# Patient Record
Sex: Male | Born: 1960
Health system: Southern US, Community
[De-identification: ages and names within clinical notes are randomized; demographics above are authoritative.]

## PROBLEM LIST (undated history)

## (undated) DIAGNOSIS — K219 Gastro-esophageal reflux disease without esophagitis: Secondary | ICD-10-CM

## (undated) DIAGNOSIS — N471 Phimosis: Secondary | ICD-10-CM

## (undated) DIAGNOSIS — E119 Type 2 diabetes mellitus without complications: Secondary | ICD-10-CM

---

## 2007-01-31 ENCOUNTER — Encounter: Admission: RE | Admit: 2007-01-31 | Discharge: 2007-01-31 | Payer: Self-pay | Admitting: Family Medicine

## 2013-03-18 ENCOUNTER — Other Ambulatory Visit: Payer: Self-pay | Admitting: Hematology & Oncology

## 2013-04-02 ENCOUNTER — Other Ambulatory Visit: Payer: Self-pay | Admitting: Urology

## 2013-04-06 ENCOUNTER — Encounter (HOSPITAL_BASED_OUTPATIENT_CLINIC_OR_DEPARTMENT_OTHER): Payer: Self-pay | Admitting: *Deleted

## 2013-04-06 NOTE — Progress Notes (Signed)
NPO AFTER MN. ARRIVES AT 0845. NEEDS HG. 

## 2013-04-07 ENCOUNTER — Encounter (HOSPITAL_BASED_OUTPATIENT_CLINIC_OR_DEPARTMENT_OTHER): Payer: Self-pay | Admitting: Anesthesiology

## 2013-04-07 ENCOUNTER — Ambulatory Visit (HOSPITAL_BASED_OUTPATIENT_CLINIC_OR_DEPARTMENT_OTHER): Payer: 59 | Admitting: Anesthesiology

## 2013-04-07 ENCOUNTER — Ambulatory Visit (HOSPITAL_BASED_OUTPATIENT_CLINIC_OR_DEPARTMENT_OTHER)
Admission: RE | Admit: 2013-04-07 | Discharge: 2013-04-07 | Disposition: A | Discharge status: Home / Self Care | Payer: 59 | Source: Ambulatory Visit | Attending: Urology | Admitting: Urology

## 2013-04-07 ENCOUNTER — Encounter (HOSPITAL_BASED_OUTPATIENT_CLINIC_OR_DEPARTMENT_OTHER)
Admission: RE | Disposition: A | Discharge status: Home / Self Care | Payer: Self-pay | Source: Ambulatory Visit | Attending: Urology

## 2013-04-07 DIAGNOSIS — Z79899 Other long term (current) drug therapy: Secondary | ICD-10-CM | POA: Insufficient documentation

## 2013-04-07 DIAGNOSIS — K219 Gastro-esophageal reflux disease without esophagitis: Secondary | ICD-10-CM | POA: Insufficient documentation

## 2013-04-07 DIAGNOSIS — N471 Phimosis: Secondary | ICD-10-CM | POA: Insufficient documentation

## 2013-04-07 DIAGNOSIS — N476 Balanoposthitis: Secondary | ICD-10-CM | POA: Insufficient documentation

## 2013-04-07 DIAGNOSIS — N478 Other disorders of prepuce: Secondary | ICD-10-CM | POA: Insufficient documentation

## 2013-04-07 DIAGNOSIS — E669 Obesity, unspecified: Secondary | ICD-10-CM | POA: Insufficient documentation

## 2013-04-07 DIAGNOSIS — N529 Male erectile dysfunction, unspecified: Secondary | ICD-10-CM | POA: Insufficient documentation

## 2013-04-07 HISTORY — DX: Phimosis: N47.1

## 2013-04-07 HISTORY — DX: Gastro-esophageal reflux disease without esophagitis: K21.9

## 2013-04-07 HISTORY — PX: CIRCUMCISION: SHX1350

## 2013-04-07 LAB — POCT HEMOGLOBIN-HEMACUE: Hemoglobin: 14.8 g/dL (ref 13.0–17.0)

## 2013-04-07 SURGERY — CIRCUMCISION, ADULT
Anesthesia: General | Site: Penis | Wound class: Clean Contaminated

## 2013-04-07 MED ORDER — FENTANYL CITRATE 0.05 MG/ML IJ SOLN
INTRAMUSCULAR | Status: DC | PRN
  Administered 2013-04-07: 50 ug via INTRAVENOUS
  Administered 2013-04-07 (×2): 25 ug via INTRAVENOUS
  Administered 2013-04-07: 50 ug via INTRAVENOUS
  Administered 2013-04-07 (×2): 25 ug via INTRAVENOUS

## 2013-04-07 MED ORDER — PROMETHAZINE HCL 25 MG/ML IJ SOLN
6.2500 mg | INTRAMUSCULAR | Status: DC | PRN
  Filled 2013-04-07: qty 1

## 2013-04-07 MED ORDER — EPHEDRINE SULFATE 50 MG/ML IJ SOLN
INTRAMUSCULAR | Status: DC | PRN
  Administered 2013-04-07: 15 mg via INTRAVENOUS

## 2013-04-07 MED ORDER — MINERAL OIL LIGHT 100 % EX OIL
TOPICAL_OIL | CUTANEOUS | Status: DC | PRN
  Administered 2013-04-07: 1 via TOPICAL

## 2013-04-07 MED ORDER — LIDOCAINE HCL (CARDIAC) 20 MG/ML IV SOLN
INTRAVENOUS | Status: DC | PRN
  Administered 2013-04-07: 75 mg via INTRAVENOUS

## 2013-04-07 MED ORDER — LACTATED RINGERS IV SOLN
INTRAVENOUS | Status: DC
  Administered 2013-04-07 (×2): via INTRAVENOUS
  Filled 2013-04-07: qty 1000

## 2013-04-07 MED ORDER — MIDAZOLAM HCL 5 MG/5ML IJ SOLN
INTRAMUSCULAR | Status: DC | PRN
  Administered 2013-04-07: 2 mg via INTRAVENOUS

## 2013-04-07 MED ORDER — PROPOFOL 10 MG/ML IV BOLUS
INTRAVENOUS | Status: DC | PRN
  Administered 2013-04-07: 300 mg via INTRAVENOUS

## 2013-04-07 MED ORDER — DEXAMETHASONE SODIUM PHOSPHATE 4 MG/ML IJ SOLN
INTRAMUSCULAR | Status: DC | PRN
  Administered 2013-04-07: 10 mg via INTRAVENOUS

## 2013-04-07 MED ORDER — FENTANYL CITRATE 0.05 MG/ML IJ SOLN
25.0000 ug | INTRAMUSCULAR | Status: DC | PRN
  Filled 2013-04-07: qty 1

## 2013-04-07 MED ORDER — BUPIVACAINE HCL (PF) 0.25 % IJ SOLN
INTRAMUSCULAR | Status: DC | PRN
  Administered 2013-04-07: 10 mL

## 2013-04-07 SURGICAL SUPPLY — 29 items
BANDAGE CO FLEX L/F 1IN X 5YD (GAUZE/BANDAGES/DRESSINGS) IMPLANT
BANDAGE CONFORM 2  STR LF (GAUZE/BANDAGES/DRESSINGS) ×2 IMPLANT
BLADE SURG 15 STRL LF DISP TIS (BLADE) ×1 IMPLANT
BLADE SURG 15 STRL SS (BLADE) ×1
BNDG COHESIVE 1X5 TAN STRL LF (GAUZE/BANDAGES/DRESSINGS) ×2 IMPLANT
CLOTH BEACON ORANGE TIMEOUT ST (SAFETY) ×2 IMPLANT
COVER MAYO STAND STRL (DRAPES) ×2 IMPLANT
COVER TABLE BACK 60X90 (DRAPES) ×2 IMPLANT
DRAPE PED LAPAROTOMY (DRAPES) ×2 IMPLANT
ELECT REM PT RETURN 9FT ADLT (ELECTROSURGICAL) ×2
ELECTRODE REM PT RTRN 9FT ADLT (ELECTROSURGICAL) ×1 IMPLANT
GAUZE VASELINE 1X8 (GAUZE/BANDAGES/DRESSINGS) ×2 IMPLANT
GLOVE BIO SURGEON STRL SZ7 (GLOVE) ×2 IMPLANT
GLOVE BIO SURGEON STRL SZ7.5 (GLOVE) ×2 IMPLANT
GLOVE BIOGEL M 6.5 STRL (GLOVE) ×4 IMPLANT
GLOVE ECLIPSE 6.0 STRL STRAW (GLOVE) ×2 IMPLANT
GLOVE INDICATOR 6.5 STRL GRN (GLOVE) ×2 IMPLANT
GOWN PREVENTION PLUS LG XLONG (DISPOSABLE) ×2 IMPLANT
GOWN PREVENTION PLUS XLARGE (GOWN DISPOSABLE) ×4 IMPLANT
NEEDLE HYPO 25X1 1.5 SAFETY (NEEDLE) ×2 IMPLANT
PACK BASIN DAY SURGERY FS (CUSTOM PROCEDURE TRAY) ×2 IMPLANT
PENCIL BUTTON HOLSTER BLD 10FT (ELECTRODE) ×2 IMPLANT
SPONGE GAUZE 4X4 12PLY (GAUZE/BANDAGES/DRESSINGS) ×2 IMPLANT
SUT CHROMIC 4 0 P 3 18 (SUTURE) ×4 IMPLANT
SUT CHROMIC 5 0 RB 1 27 (SUTURE) IMPLANT
SYR CONTROL 10ML LL (SYRINGE) ×2 IMPLANT
TOWEL OR 17X24 6PK STRL BLUE (TOWEL DISPOSABLE) ×4 IMPLANT
TRAY DSU PREP LF (CUSTOM PROCEDURE TRAY) ×2 IMPLANT
WATER STERILE IRR 500ML POUR (IV SOLUTION) IMPLANT

## 2013-04-07 NOTE — Anesthesia Preprocedure Evaluation (Signed)
Anesthesia Evaluation  Patient identified by MRN, date of birth, ID band Patient awake  General Assessment Comment:Phimosis.  Reviewed: Allergy & Precautions, H&P , NPO status , Patient's Chart, lab work & pertinent test results  Airway Mallampati: II TM Distance: >3 FB Neck ROM: Full    Dental no notable dental hx.    Pulmonary neg pulmonary ROS,  breath sounds clear to auscultation  Pulmonary exam normal       Cardiovascular Exercise Tolerance: Good negative cardio ROS  Rhythm:Regular Rate:Normal     Neuro/Psych negative neurological ROS  negative psych ROS   GI/Hepatic Neg liver ROS, GERD-  ,  Endo/Other  negative endocrine ROS  Renal/GU negative Renal ROS  negative genitourinary   Musculoskeletal negative musculoskeletal ROS (+)   Abdominal (+) + obese,   Peds negative pediatric ROS (+)  Hematology negative hematology ROS (+)   Anesthesia Other Findings   Reproductive/Obstetrics negative OB ROS                           Anesthesia Physical Anesthesia Plan  ASA: II  Anesthesia Plan: General   Post-op Pain Management:    Induction: Intravenous  Airway Management Planned:   Additional Equipment:   Intra-op Plan:   Post-operative Plan: Extubation in OR  Informed Consent: I have reviewed the patients History and Physical, chart, labs and discussed the procedure including the risks, benefits and alternatives for the proposed anesthesia with the patient or authorized representative who has indicated his/her understanding and acceptance.   Dental advisory given  Plan Discussed with: CRNA  Anesthesia Plan Comments:         Anesthesia Quick Evaluation

## 2013-04-07 NOTE — Anesthesia Postprocedure Evaluation (Signed)
  Anesthesia Post-op Note  Patient: Neil Murphy  Procedure(s) Performed: Procedure(s) (LRB): CIRCUMCISION  (N/A)  Patient Location: PACU  Anesthesia Type: General  Level of Consciousness: awake and alert   Airway and Oxygen Therapy: Patient Spontanous Breathing  Post-op Pain: mild  Post-op Assessment: Post-op Vital signs reviewed, Patient's Cardiovascular Status Stable, Respiratory Function Stable, Patent Airway and No signs of Nausea or vomiting  Last Vitals:  Filed Vitals:   04/07/13 1137  BP: 113/53  Pulse: 80  Temp: 37.2 C  Resp: 18    Post-op Vital Signs: stable   Complications: No apparent anesthesia complications

## 2013-04-07 NOTE — Transfer of Care (Signed)
Immediate Anesthesia Transfer of Care Note  Patient: Neil Murphy  Procedure(s) Performed: Procedure(s) (LRB): CIRCUMCISION  (N/A)  Patient Location: Patient transported to PACU with oxygen via face mask at 4 Liters / Min  Anesthesia Type: General  Level of Consciousness: awake and alert   Airway & Oxygen Therapy: Patient Spontanous Breathing and Patient connected to face mask oxygen  Post-op Assessment: Report given to PACU RN and Post -op Vital signs reviewed and stable  Post vital signs: Reviewed and stable  Dentition: Teeth and oropharynx remain in pre-op condition  Complications: No apparent anesthesia complications

## 2013-04-07 NOTE — Op Note (Signed)
Neil Murphy is a 52 y.o.   04/07/2013  General  Preop diagnosis: Phimosis, balanitis  Postop diagnosis: Same  Procedure done: Circumcision  Surgeon: Wendie Simmer. Monzerat Handler  Anesthesia: General  Indication: Patient is a 52 years old male who has been complaining of difficulty retracting his foreskin. Intercourse is painful and hygiene has become a problem. He he was found and physical examination to have phimosis and balanitis.  Procedure: The patient was identified by his wrist band and proper timeout was taken.  Under general anesthesia he was prepped and draped and placed in the supine position. A penile block was done with 0.25% Marcaine. Then 2 circumferential incisions were made on the foreskin and the foreskin in between those 2 incisions was excised. Hemostasis was secured with electrocautery. Skin approximation was then done with #4-0 chromic.  EBL: Minimal  Needles, sponges count: Correct  The patient tolerated the procedure well and left the OR in satisfactory condition to postanesthesia care unit

## 2013-04-07 NOTE — Anesthesia Procedure Notes (Signed)
Procedure Name: LMA Insertion Date/Time: 04/07/2013 10:42 AM Performed by: Fran Lowes Pre-anesthesia Checklist: Patient identified, Emergency Drugs available, Suction available and Patient being monitored Patient Re-evaluated:Patient Re-evaluated prior to inductionOxygen Delivery Method: Circle System Utilized Preoxygenation: Pre-oxygenation with 100% oxygen Intubation Type: IV induction Ventilation: Mask ventilation without difficulty LMA: LMA inserted LMA Size: 5.0 Number of attempts: 1 Airway Equipment and Method: bite block Placement Confirmation: positive ETCO2 Tube secured with: Tape Dental Injury: Teeth and Oropharynx as per pre-operative assessment

## 2013-04-07 NOTE — H&P (Signed)
  History of Present Illness     Neil Murphy has been omplaining of difficulty retracting his foreskin.  Intercourse is painful at times and he has sustained several lacerations on the foreskin.  Hygiene has also become an issue.  He was found on physical examination to have phimosis and balanitis.  He would like to be circumcised. He uses Viagra for erectile dysfunction and reports that he has good erections.   Past Medical History Problems  1. History of  Gastric Ulcer 531.90 2. History of  Heartburn 787.1  Current Meds 1. Cialis 20 MG Oral Tablet; USE AS DIRECTED; Therapy: 08Sep2009 to (Evaluate:07Sep2009);  Last Rx:08Sep2009 2. Omega-3 CF CAPS; Therapy: (Recorded:29Jul2008) to 3. Viagra 100 MG Oral Tablet; UAD - USE AS DIRECTED; Therapy: 03Jul2013 to  (Evaluate:24Nov2013)  Requested for: 03Jul2013; Last Rx:03Jul2013  Allergies Medication  1. No Known Drug Allergies  Family History Problems  1. Maternal history of  Diabetes Mellitus V18.0 2. Fraternal history of  Diabetes Mellitus V18.0 3. Fraternal history of  Family Health Status Number Of Children 2 daughters 4. Maternal history of  Heart Disease V17.49  Social History Problems  1. Marital History - Currently Married 2. Never A Smoker 3. Occupation: Psychiatric nurse  4. History of  Alcohol Use 5. History of  Caffeine Use 6. History of  Tobacco Use V15.82  Review of Systems Genitourinary, constitutional, skin, eye, otolaryngeal, hematologic/lymphatic, cardiovascular, pulmonary, endocrine, musculoskeletal, gastrointestinal, neurological and psychiatric system(s) were reviewed and pertinent findings if present are noted.  Genitourinary: penile pain.    Vitals Vital Signs [Data Includes: Last 1 Day]  17Apr2014 03:38PM  Blood Pressure: 169 / 82 Temperature: 98.5 F Heart Rate: 68  Physical Exam Constitutional: Well nourished and well developed . No acute distress.  ENT:. The ears and nose are normal in appearance.   Neck: The appearance of the neck is normal and no neck mass is present.  Pulmonary: No respiratory distress and normal respiratory rhythm and effort.  Cardiovascular: Heart rate and rhythm are normal . No peripheral edema.  Abdomen: The abdomen is soft and nontender. No masses are palpated. No CVA tenderness. No hernias are palpable. No hepatosplenomegaly noted.  Genitourinary: Examination of the penis demonstrates no discharge, no masses, no lesions and a normal meatus. The scrotum is without lesions. The right epididymis is palpably normal and non-tender. The left epididymis is palpably normal and non-tender. The right testis is non-tender and without masses. The left testis is non-tender and without masses.  Lymphatics: The femoral and inguinal nodes are not enlarged or tender.  Skin: Normal skin turgor, no visible rash and no visible skin lesions.  Neuro/Psych:. Mood and affect are appropriate.    Assessment Assessed  1. Phimosis 605 2. Balanitis 607.1 3. Male Erectile Disorder Due To Physical Condition 607.84  Plan  Circumcision.  The procedure, risks, benefits were explained to the patient.  The risks include but are not limited to hemorrhage, hematoma, infection, skin loss. He understands and wishes to proceed.   Signatures Electronically signed by : Su Grand, M.D.; Mar 26 2013  3:53PM

## 2013-04-08 ENCOUNTER — Encounter (HOSPITAL_BASED_OUTPATIENT_CLINIC_OR_DEPARTMENT_OTHER): Payer: Self-pay | Admitting: Urology

## 2016-12-14 DIAGNOSIS — R509 Fever, unspecified: Secondary | ICD-10-CM | POA: Diagnosis not present

## 2016-12-16 ENCOUNTER — Emergency Department (HOSPITAL_BASED_OUTPATIENT_CLINIC_OR_DEPARTMENT_OTHER): Payer: 59

## 2016-12-16 ENCOUNTER — Inpatient Hospital Stay (HOSPITAL_BASED_OUTPATIENT_CLINIC_OR_DEPARTMENT_OTHER)
Admission: EM | Admit: 2016-12-16 | Discharge: 2016-12-28 | DRG: 853 | Disposition: A | Discharge status: Home Health | Payer: 59 | Attending: Family Medicine | Admitting: Family Medicine

## 2016-12-16 ENCOUNTER — Encounter (HOSPITAL_BASED_OUTPATIENT_CLINIC_OR_DEPARTMENT_OTHER): Payer: Self-pay | Admitting: *Deleted

## 2016-12-16 DIAGNOSIS — R0603 Acute respiratory distress: Secondary | ICD-10-CM | POA: Diagnosis not present

## 2016-12-16 DIAGNOSIS — A419 Sepsis, unspecified organism: Secondary | ICD-10-CM | POA: Diagnosis not present

## 2016-12-16 DIAGNOSIS — D649 Anemia, unspecified: Secondary | ICD-10-CM | POA: Diagnosis present

## 2016-12-16 DIAGNOSIS — J810 Acute pulmonary edema: Secondary | ICD-10-CM | POA: Diagnosis present

## 2016-12-16 DIAGNOSIS — D72819 Decreased white blood cell count, unspecified: Secondary | ICD-10-CM | POA: Diagnosis present

## 2016-12-16 DIAGNOSIS — J111 Influenza due to unidentified influenza virus with other respiratory manifestations: Secondary | ICD-10-CM

## 2016-12-16 DIAGNOSIS — E876 Hypokalemia: Secondary | ICD-10-CM | POA: Diagnosis present

## 2016-12-16 DIAGNOSIS — K219 Gastro-esophageal reflux disease without esophagitis: Secondary | ICD-10-CM | POA: Diagnosis present

## 2016-12-16 DIAGNOSIS — J9601 Acute respiratory failure with hypoxia: Secondary | ICD-10-CM | POA: Diagnosis not present

## 2016-12-16 DIAGNOSIS — Z4682 Encounter for fitting and adjustment of non-vascular catheter: Secondary | ICD-10-CM | POA: Diagnosis not present

## 2016-12-16 DIAGNOSIS — Y95 Nosocomial condition: Secondary | ICD-10-CM | POA: Diagnosis present

## 2016-12-16 DIAGNOSIS — Z01818 Encounter for other preprocedural examination: Secondary | ICD-10-CM

## 2016-12-16 DIAGNOSIS — R0602 Shortness of breath: Secondary | ICD-10-CM | POA: Diagnosis not present

## 2016-12-16 DIAGNOSIS — R6520 Severe sepsis without septic shock: Secondary | ICD-10-CM | POA: Diagnosis not present

## 2016-12-16 DIAGNOSIS — R0902 Hypoxemia: Secondary | ICD-10-CM

## 2016-12-16 DIAGNOSIS — Z4659 Encounter for fitting and adjustment of other gastrointestinal appliance and device: Secondary | ICD-10-CM

## 2016-12-16 DIAGNOSIS — A403 Sepsis due to Streptococcus pneumoniae: Principal | ICD-10-CM | POA: Diagnosis present

## 2016-12-16 DIAGNOSIS — J13 Pneumonia due to Streptococcus pneumoniae: Secondary | ICD-10-CM | POA: Diagnosis present

## 2016-12-16 DIAGNOSIS — D473 Essential (hemorrhagic) thrombocythemia: Secondary | ICD-10-CM | POA: Diagnosis not present

## 2016-12-16 DIAGNOSIS — J189 Pneumonia, unspecified organism: Secondary | ICD-10-CM | POA: Diagnosis present

## 2016-12-16 DIAGNOSIS — D509 Iron deficiency anemia, unspecified: Secondary | ICD-10-CM | POA: Diagnosis present

## 2016-12-16 DIAGNOSIS — E119 Type 2 diabetes mellitus without complications: Secondary | ICD-10-CM | POA: Diagnosis not present

## 2016-12-16 DIAGNOSIS — K5900 Constipation, unspecified: Secondary | ICD-10-CM | POA: Diagnosis not present

## 2016-12-16 DIAGNOSIS — E870 Hyperosmolality and hypernatremia: Secondary | ICD-10-CM | POA: Diagnosis not present

## 2016-12-16 DIAGNOSIS — N179 Acute kidney failure, unspecified: Secondary | ICD-10-CM | POA: Diagnosis present

## 2016-12-16 DIAGNOSIS — R739 Hyperglycemia, unspecified: Secondary | ICD-10-CM | POA: Diagnosis not present

## 2016-12-16 DIAGNOSIS — R509 Fever, unspecified: Secondary | ICD-10-CM

## 2016-12-16 DIAGNOSIS — Z978 Presence of other specified devices: Secondary | ICD-10-CM

## 2016-12-16 DIAGNOSIS — Z794 Long term (current) use of insulin: Secondary | ICD-10-CM | POA: Diagnosis not present

## 2016-12-16 DIAGNOSIS — R05 Cough: Secondary | ICD-10-CM | POA: Diagnosis not present

## 2016-12-16 DIAGNOSIS — J181 Lobar pneumonia, unspecified organism: Secondary | ICD-10-CM | POA: Diagnosis not present

## 2016-12-16 DIAGNOSIS — R066 Hiccough: Secondary | ICD-10-CM | POA: Diagnosis present

## 2016-12-16 DIAGNOSIS — R918 Other nonspecific abnormal finding of lung field: Secondary | ICD-10-CM | POA: Diagnosis not present

## 2016-12-16 DIAGNOSIS — T502X5A Adverse effect of carbonic-anhydrase inhibitors, benzothiadiazides and other diuretics, initial encounter: Secondary | ICD-10-CM | POA: Diagnosis present

## 2016-12-16 DIAGNOSIS — E872 Acidosis: Secondary | ICD-10-CM | POA: Diagnosis present

## 2016-12-16 DIAGNOSIS — J80 Acute respiratory distress syndrome: Secondary | ICD-10-CM

## 2016-12-16 DIAGNOSIS — E1165 Type 2 diabetes mellitus with hyperglycemia: Secondary | ICD-10-CM | POA: Diagnosis present

## 2016-12-16 DIAGNOSIS — E871 Hypo-osmolality and hyponatremia: Secondary | ICD-10-CM | POA: Diagnosis present

## 2016-12-16 DIAGNOSIS — J1108 Influenza due to unidentified influenza virus with specified pneumonia: Secondary | ICD-10-CM | POA: Diagnosis present

## 2016-12-16 DIAGNOSIS — J984 Other disorders of lung: Secondary | ICD-10-CM | POA: Diagnosis not present

## 2016-12-16 DIAGNOSIS — J9600 Acute respiratory failure, unspecified whether with hypoxia or hypercapnia: Secondary | ICD-10-CM | POA: Diagnosis not present

## 2016-12-16 HISTORY — DX: Type 2 diabetes mellitus without complications: E11.9

## 2016-12-16 LAB — CBC WITH DIFFERENTIAL/PLATELET
BASOS PCT: 0 %
Band Neutrophils: 42 %
Basophils Absolute: 0 10*3/uL (ref 0.0–0.1)
Blasts: 0 %
EOS PCT: 0 %
Eosinophils Absolute: 0 10*3/uL (ref 0.0–0.7)
HCT: 37 % — ABNORMAL LOW (ref 39.0–52.0)
Hemoglobin: 12.8 g/dL — ABNORMAL LOW (ref 13.0–17.0)
LYMPHS PCT: 37 %
Lymphs Abs: 0.6 10*3/uL — ABNORMAL LOW (ref 0.7–4.0)
MCH: 27.5 pg (ref 26.0–34.0)
MCHC: 34.6 g/dL (ref 30.0–36.0)
MCV: 79.6 fL (ref 78.0–100.0)
MONO ABS: 0.1 10*3/uL (ref 0.1–1.0)
Metamyelocytes Relative: 9 %
Monocytes Relative: 6 %
Myelocytes: 1 %
NEUTROS ABS: 0.9 10*3/uL — AB (ref 1.7–7.7)
NEUTROS PCT: 5 %
NRBC: 0 /100{WBCs}
Platelets: 169 10*3/uL (ref 150–400)
Promyelocytes Absolute: 0 %
RBC: 4.65 MIL/uL (ref 4.22–5.81)
RDW: 14.2 % (ref 11.5–15.5)
WBC: 1.6 10*3/uL — ABNORMAL LOW (ref 4.0–10.5)

## 2016-12-16 LAB — COMPREHENSIVE METABOLIC PANEL
ALT: 25 U/L (ref 17–63)
AST: 40 U/L (ref 15–41)
Albumin: 3 g/dL — ABNORMAL LOW (ref 3.5–5.0)
Alkaline Phosphatase: 32 U/L — ABNORMAL LOW (ref 38–126)
Anion gap: 14 (ref 5–15)
BUN: 57 mg/dL — ABNORMAL HIGH (ref 6–20)
CO2: 20 mmol/L — ABNORMAL LOW (ref 22–32)
CREATININE: 1.93 mg/dL — AB (ref 0.61–1.24)
Calcium: 9.3 mg/dL (ref 8.9–10.3)
Chloride: 94 mmol/L — ABNORMAL LOW (ref 101–111)
GFR, EST AFRICAN AMERICAN: 44 mL/min — AB (ref >60)
GFR, EST NON AFRICAN AMERICAN: 38 mL/min — AB (ref >60)
Glucose, Bld: 499 mg/dL — ABNORMAL HIGH (ref 65–99)
POTASSIUM: 4.9 mmol/L (ref 3.5–5.1)
Sodium: 128 mmol/L — ABNORMAL LOW (ref 135–145)
Total Bilirubin: 1.3 mg/dL — ABNORMAL HIGH (ref 0.3–1.2)
Total Protein: 7.5 g/dL (ref 6.5–8.1)

## 2016-12-16 LAB — URINALYSIS, ROUTINE W REFLEX MICROSCOPIC
Bilirubin Urine: NEGATIVE
Glucose, UA: >=500 mg/dL — AB
Ketones, ur: NEGATIVE mg/dL
LEUKOCYTES UA: NEGATIVE
NITRITE: NEGATIVE
Protein, ur: 30 mg/dL — AB
SPECIFIC GRAVITY, URINE: 1.023 (ref 1.005–1.030)
pH: 5 (ref 5.0–8.0)

## 2016-12-16 LAB — I-STAT VENOUS BLOOD GAS, ED
Bicarbonate: 23.7 mmol/L (ref 20.0–28.0)
O2 Saturation: 88 %
PH VEN: 7.419 (ref 7.250–7.430)
PO2 VEN: 53 mmHg — AB (ref 32.0–45.0)
Patient temperature: 98.9
TCO2: 25 mmol/L (ref 0–100)
pCO2, Ven: 36.7 mmHg — ABNORMAL LOW (ref 44.0–60.0)

## 2016-12-16 LAB — I-STAT CG4 LACTIC ACID, ED
LACTIC ACID, VENOUS: 4.87 mmol/L — AB (ref 0.5–1.9)
Lactic Acid, Venous: 3.88 mmol/L (ref 0.5–1.9)

## 2016-12-16 LAB — URINALYSIS, MICROSCOPIC (REFLEX): BACTERIA UA: NONE SEEN

## 2016-12-16 LAB — TROPONIN I

## 2016-12-16 MED ORDER — ALBUTEROL SULFATE (2.5 MG/3ML) 0.083% IN NEBU
2.5000 mg | INHALATION_SOLUTION | Freq: Once | RESPIRATORY_TRACT | Status: AC
  Administered 2016-12-16: 2.5 mg via RESPIRATORY_TRACT
  Filled 2016-12-16: qty 3

## 2016-12-16 MED ORDER — DEXTROSE 5 % IV SOLN
1.0000 g | Freq: Once | INTRAVENOUS | Status: AC
  Administered 2016-12-16: 1 g via INTRAVENOUS
  Filled 2016-12-16: qty 10

## 2016-12-16 MED ORDER — SODIUM CHLORIDE 0.9 % IV BOLUS (SEPSIS)
1000.0000 mL | Freq: Once | INTRAVENOUS | Status: AC
  Administered 2016-12-16: 1000 mL via INTRAVENOUS

## 2016-12-16 MED ORDER — DEXTROSE 5 % IV SOLN
500.0000 mg | INTRAVENOUS | Status: AC
  Administered 2016-12-17 – 2016-12-22 (×6): 500 mg via INTRAVENOUS
  Filled 2016-12-16 (×6): qty 500

## 2016-12-16 MED ORDER — AZITHROMYCIN 500 MG IV SOLR
INTRAVENOUS | Status: AC
  Filled 2016-12-16: qty 500

## 2016-12-16 MED ORDER — DEXTROSE 5 % IV SOLN: 1.0000 g | INTRAVENOUS | Status: DC

## 2016-12-16 MED ORDER — IPRATROPIUM-ALBUTEROL 0.5-2.5 (3) MG/3ML IN SOLN
3.0000 mL | Freq: Once | RESPIRATORY_TRACT | Status: AC
  Administered 2016-12-16: 3 mL via RESPIRATORY_TRACT
  Filled 2016-12-16: qty 3

## 2016-12-16 MED ORDER — DEXTROSE 5 % IV SOLN
500.0000 mg | Freq: Once | INTRAVENOUS | Status: AC
  Administered 2016-12-16: 500 mg via INTRAVENOUS

## 2016-12-16 MED ORDER — SODIUM CHLORIDE 0.9 % IV SOLN
INTRAVENOUS | Status: DC
  Administered 2016-12-16: 1000 mL via INTRAVENOUS
  Administered 2016-12-17 – 2016-12-19 (×5): via INTRAVENOUS

## 2016-12-16 NOTE — ED Triage Notes (Signed)
Pt reports that he was dx with the flu on Friday.  Productive cough.  Low grade fever today.  Medication taken 45 minutes ago. Pt tachypnea in triage.

## 2016-12-16 NOTE — ED Notes (Signed)
Hospitalist paged via [facility] for admission.

## 2016-12-16 NOTE — ED Notes (Signed)
ED Provider at bedside. 

## 2016-12-16 NOTE — ED Notes (Signed)
Increased FIO2 to 50% via high Flow nasal cannula

## 2016-12-16 NOTE — ED Notes (Signed)
Xray at BS 

## 2016-12-16 NOTE — ED Notes (Signed)
Family at bedside. 

## 2016-12-16 NOTE — ED Provider Notes (Signed)
[place] [facility] Provider Note   CSN: [id number] Arrival date & time: 12/16/16  1906  By signing my name below, I, Neta Mends, attest that this documentation has been prepared under the direction and in the presence of Blanchie Dessert, MD . Electronically Signed: Neta Mends, ED Scribe. 12/16/2016. 7:31 PM.    History   Chief Complaint Chief Complaint  Patient presents with  . URI    The history is provided by the patient. No language interpreter was used.   HPI Comments:  Brydon Wenzinger is a 56 y.o. male who presents to the Emergency Department complaining of multiple URI symptoms x 1 week. Pt complains of associated congestion, weakness, sore throat, cough, fever, SOB, and decreased appetit. Pt's wife states that pt began spitting up a reddish-brown sputum yesterday. Pt was diagnosed with the flu 2 days ago. Pt has been drinking fluids normally. Pt is not a smoker, does not drink EtOH, and denies hx of asthma. No alleviating factors noted. Pt denies vomiting, diarrhea.  Past Medical History:  Diagnosis Date  . GERD (gastroesophageal reflux disease)    OCCASIONALLY  . Phimosis     There are no active problems to display for this patient.   Past Surgical History:  Procedure Laterality Date  . CIRCUMCISION N/A 04/07/2013   Procedure: CIRCUMCISION ;  Surgeon: Hanley Ben, MD;  Location: [facility];  Service: Urology;  Laterality: N/A;       Home Medications    Prior to Admission medications   Medication Sig Start Date End Date Taking? Authorizing Provider  Multiple Vitamin (MULTIVITAMIN) tablet Take 1 tablet by mouth daily.    Historical Provider, MD    Family History History reviewed. No pertinent family history.  Social History Social History  Substance Use Topics  . Smoking status: Never Smoker  . Smokeless tobacco: Never Used  . Alcohol use No     Allergies   Patient has no known allergies.   Review of  Systems Review of Systems  Constitutional: Positive for fever.  HENT: Positive for congestion and sore throat.   Respiratory: Positive for cough and shortness of breath.   Gastrointestinal: Negative for diarrhea and vomiting.  Neurological: Positive for weakness.  All other systems reviewed and are negative.    Physical Exam Updated Vital Signs BP 117/60 (BP Location: Left Arm)   Pulse 102   Temp 99.3 F (37.4 C) (Oral)   Resp (!) 38   Ht 5\' 8"  (1.727 m)   Wt 200 lb (90.7 kg)   SpO2 (!) 86%   BMI 30.41 kg/m   Physical Exam  Constitutional: He appears well-developed and well-nourished.  HENT:  Head: Normocephalic and atraumatic.  Ears occluded bilaterally with cerumen. Mild erythema of the throat.   Eyes: Conjunctivae are normal.  Neck: Neck supple.  Cardiovascular: Regular rhythm, normal heart sounds and intact distal pulses.   No murmur heard. Tachycardia  Pulmonary/Chest: Effort normal. No respiratory distress.  Decreased breath sounds in all lung fields, coarse crackles in bilateral bases.  Abdominal: Soft. There is no tenderness.  Musculoskeletal: He exhibits no edema.  Neurological: He is alert.  Skin: Skin is warm and dry.  Psychiatric: He has a normal mood and affect.  Nursing note and vitals reviewed.    ED Treatments / Results  DIAGNOSTIC STUDIES:  Oxygen Saturation is 92% on NCO2, normal by my interpretation.    COORDINATION OF CARE:  7:31 PM Discussed treatment plan with pt at bedside and  pt agreed to plan.   Labs (all labs ordered are listed, but only abnormal results are displayed) Labs Reviewed  CBC WITH DIFFERENTIAL/PLATELET - Abnormal; Notable for the following:       Result Value   WBC 1.6 (*)    Hemoglobin 12.8 (*)    HCT 37.0 (*)    Neutro Abs 0.9 (*)    Lymphs Abs 0.6 (*)    All other components within normal limits  COMPREHENSIVE METABOLIC PANEL - Abnormal; Notable for the following:    Sodium 128 (*)    Chloride 94 (*)    CO2  20 (*)    Glucose, Bld 499 (*)    BUN 57 (*)    Creatinine, Ser 1.93 (*)    Albumin 3.0 (*)    Alkaline Phosphatase 32 (*)    Total Bilirubin 1.3 (*)    GFR calc non Af Amer 38 (*)    GFR calc Af Amer 44 (*)    All other components within normal limits  URINALYSIS, ROUTINE W REFLEX MICROSCOPIC - Abnormal; Notable for the following:    APPearance CLOUDY (*)    Glucose, UA >=500 (*)    Hgb urine dipstick TRACE (*)    Protein, ur 30 (*)    All other components within normal limits  URINALYSIS, MICROSCOPIC (REFLEX) - Abnormal; Notable for the following:    Squamous Epithelial / LPF 0-5 (*)    All other components within normal limits  I-STAT CG4 LACTIC ACID, ED - Abnormal; Notable for the following:    Lactic Acid, Venous 4.87 (*)    All other components within normal limits  I-STAT CG4 LACTIC ACID, ED - Abnormal; Notable for the following:    Lactic Acid, Venous 3.88 (*)    All other components within normal limits  I-STAT VENOUS BLOOD GAS, ED - Abnormal; Notable for the following:    pCO2, Ven 36.7 (*)    pO2, Ven 53.0 (*)    All other components within normal limits  CULTURE, BLOOD (ROUTINE X 2)  CULTURE, BLOOD (ROUTINE X 2)  URINE CULTURE  TROPONIN I  PATHOLOGIST SMEAR REVIEW  I-STAT CG4 LACTIC ACID, ED  I-STAT CG4 LACTIC ACID, ED  CBG MONITORING, ED  CBG MONITORING, ED    EKG  EKG Interpretation  Date/Time:  Sunday December 16 2016 19:53:12 EST Ventricular Rate:  94 PR Interval:    QRS Duration: 105 QT Interval:  366 QTC Calculation: 458 R Axis:   36 Text Interpretation:  Sinus rhythm No previous tracing Confirmed by Maryan Rued  MD, Aleeyah Bensen ([id number]) on 12/16/2016 8:12:05 PM       Radiology Dg Chest Port 1 View  Result Date: 12/16/2016 CLINICAL DATA:  Shortness of breath and fever EXAM: PORTABLE CHEST 1 VIEW COMPARISON:  January 31, 2007 FINDINGS: There is airspace consolidation throughout the left mid lower lung zone. There is patchy airspace opacity in the right  mid lower lung zones, less than on the left. Lungs elsewhere are clear. Heart size and pulmonary vascularity are normal. No adenopathy. No bone lesions. IMPRESSION: Multifocal pneumonia, most severe in the left base. Heart size and pulmonary vascularity are normal. No adenopathy. Followup PA and lateral chest radiographs recommended in 3-4 weeks following trial of antibiotic therapy to ensure resolution and exclude underlying malignancy. Note that in the right lower lung region, one view areas of consolidation does have a somewhat nodular appearance. Follow-up imaging strongly advised with particular attention to the right lower lobe on subsequent examination.  Electronically Signed   By: Lowella Grip III M.D.   On: 12/16/2016 19:55    Procedures Procedures (including critical care time)  Medications Ordered in ED Medications  cefTRIAXone (ROCEPHIN) 1 g in dextrose 5 % 50 mL IVPB (not administered)  azithromycin (ZITHROMAX) 500 mg in dextrose 5 % 250 mL IVPB (not administered)  azithromycin (ZITHROMAX) 500 MG injection (not administered)  0.9 %  sodium chloride infusion (not administered)  ipratropium-albuterol (DUONEB) 0.5-2.5 (3) MG/3ML nebulizer solution 3 mL (3 mLs Nebulization Given 12/16/16 1931)  albuterol (PROVENTIL) (2.5 MG/3ML) 0.083% nebulizer solution 2.5 mg (2.5 mg Nebulization Given 12/16/16 1931)  sodium chloride 0.9 % bolus 1,000 mL (0 mLs Intravenous Stopped 12/16/16 2110)    And  sodium chloride 0.9 % bolus 1,000 mL (0 mLs Intravenous Stopped 12/16/16 2107)    And  sodium chloride 0.9 % bolus 1,000 mL (0 mLs Intravenous Stopped 12/16/16 2108)  cefTRIAXone (ROCEPHIN) 1 g in dextrose 5 % 50 mL IVPB (0 g Intravenous Stopped 12/16/16 2103)  azithromycin (ZITHROMAX) 500 mg in dextrose 5 % 250 mL IVPB (0 mg Intravenous Stopped 12/16/16 2206)     Initial Impression / Assessment and Plan / ED Course  I have reviewed the triage vital signs and the nursing notes.  Pertinent labs & imaging  results that were available during my care of the patient were reviewed by me and considered in my medical decision making (see chart for details).  Clinical Course    Patient is a healthy 56 year old male presenting today with flulike symptoms for the last 1 week. Patient states he was diagnosed 2 days ago with the flu from his doctor's office however he started to develop worsening shortness of breath and productive cough. Upon arrival here today patient was hypoxic at 86%, tachypnea, tachycardic. He had coarse breath sounds bilaterally and concern for pneumonia. Patient was started on the sepsis protocol. Initial lactic acid was 4.87 and he was given 30/kg bolus which equaled 3 L. He was also started on azithromycin, Rocephin and high flow nasal cannula at 15 and 50%. Initially discussed the case with critical care and at this time patient is breathing comfortably at 25 times a minute and satting 91%. Tachycardia has resolved. Patient is found to have a leukopenia of 1.6, normal platelets, new acute renal insufficiency of 1.93, hyponatremia 128 and an new hyperglycemia of 499. Troponin within normal limits and EKG without acute findings.  Patient will be admitted to a stepdown bed.   Sepsis - Repeat Assessment  Performed at:    10:18  Vitals     Blood pressure 109/80, pulse 96, temperature 99 F (37.2 C), temperature source Oral, resp. rate (!) 27, height 5\' 8"  (1.727 m), weight 200 lb (90.7 kg), SpO2 92 %.  Heart:     Regular rate and rhythm  Lungs:    Rhonchi  Capillary Refill:   <2 sec  Peripheral Pulse:   Radial pulse palpable  Skin:     Normal Color   CRITICAL CARE Performed by: Blanchie Dessert Total critical care time: 45 minutes Critical care time was exclusive of separately billable procedures and treating other patients. Critical care was necessary to treat or prevent imminent or life-threatening deterioration. Critical care was time spent personally by me on the  following activities: development of treatment plan with patient and/or surrogate as well as nursing, discussions with consultants, evaluation of patient's response to treatment, examination of patient, obtaining history from patient or surrogate, ordering and performing treatments and  interventions, ordering and review of laboratory studies, ordering and review of radiographic studies, pulse oximetry and re-evaluation of patient's condition.   Final Clinical Impressions(s) / ED Diagnoses   Final diagnoses:  Community acquired pneumonia, unspecified laterality  Sepsis, due to unspecified organism ([place])  Hypoxia    New Prescriptions New Prescriptions   No medications on file   I personally performed the services described in this documentation, which was scribed in my presence.  The recorded information has been reviewed and considered.     Blanchie Dessert, MD 12/16/16 2352

## 2016-12-16 NOTE — Progress Notes (Signed)
Pharmacy Antibiotic Note  Neil Murphy is a 56 y.o. male admitted on 12/16/2016 with pneumonia.  Pharmacy has been consulted for Rocephin + Azithromycin dosing.  Plan: 1. Rocephin 1g IV every 24 hours 2. Azithromycin 500 mg IV every 24 hours 3. Pharmacy will sign off as no further dose adjustments are expected at this time  Height: 5\' 8"  (172.7 cm) Weight: 200 lb (90.7 kg) IBW/kg (Calculated) : 68.4  Temp (24hrs), Avg:99.3 F (37.4 C), Min:99.3 F (37.4 C), Max:99.3 F (37.4 C)   Recent Labs Lab 12/16/16 1941  LATICACIDVEN 4.87*    CrCl cannot be calculated (No order found.).    No Known Allergies   Thank you for allowing pharmacy to be a part of this patient's care.  Lawson Radar 12/16/2016 7:48 PM

## 2016-12-16 NOTE — ED Notes (Signed)
Placed patient on High Flow nasal cannula @ flow rate of 15 liters @ FIO2 of 50%. Patient saturations of 95%. Weaned FIO2 to 40% with saturations of 94%. Will continue to monitor and wean FIO2 and flow rate according.patient tolerating well.

## 2016-12-16 NOTE — ED Notes (Signed)
Awaiting bed assignment for admission. Given po fluids

## 2016-12-17 ENCOUNTER — Encounter (HOSPITAL_COMMUNITY): Payer: Self-pay | Admitting: Internal Medicine

## 2016-12-17 DIAGNOSIS — J181 Lobar pneumonia, unspecified organism: Secondary | ICD-10-CM

## 2016-12-17 DIAGNOSIS — J111 Influenza due to unidentified influenza virus with other respiratory manifestations: Secondary | ICD-10-CM

## 2016-12-17 LAB — HIV ANTIBODY (ROUTINE TESTING W REFLEX): HIV Screen 4th Generation wRfx: NONREACTIVE

## 2016-12-17 LAB — BLOOD CULTURE ID PANEL (REFLEXED)
Acinetobacter baumannii: NOT DETECTED
CANDIDA ALBICANS: NOT DETECTED
CANDIDA GLABRATA: NOT DETECTED
Candida krusei: NOT DETECTED
Candida parapsilosis: NOT DETECTED
Candida tropicalis: NOT DETECTED
ENTEROBACTERIACEAE SPECIES: NOT DETECTED
Enterobacter cloacae complex: NOT DETECTED
Enterococcus species: NOT DETECTED
Escherichia coli: NOT DETECTED
HAEMOPHILUS INFLUENZAE: NOT DETECTED
Klebsiella oxytoca: NOT DETECTED
Klebsiella pneumoniae: NOT DETECTED
Listeria monocytogenes: NOT DETECTED
NEISSERIA MENINGITIDIS: NOT DETECTED
PROTEUS SPECIES: NOT DETECTED
PSEUDOMONAS AERUGINOSA: NOT DETECTED
STAPHYLOCOCCUS SPECIES: NOT DETECTED
STREPTOCOCCUS AGALACTIAE: NOT DETECTED
STREPTOCOCCUS PNEUMONIAE: DETECTED — AB
STREPTOCOCCUS SPECIES: DETECTED — AB
Serratia marcescens: NOT DETECTED
Staphylococcus aureus (BCID): NOT DETECTED
Streptococcus pyogenes: NOT DETECTED

## 2016-12-17 LAB — BASIC METABOLIC PANEL
Anion gap: 10 (ref 5–15)
BUN: 40 mg/dL — AB (ref 6–20)
CO2: 23 mmol/L (ref 22–32)
Calcium: 8.7 mg/dL — ABNORMAL LOW (ref 8.9–10.3)
Chloride: 104 mmol/L (ref 101–111)
Creatinine, Ser: 1.43 mg/dL — ABNORMAL HIGH (ref 0.61–1.24)
GFR calc Af Amer: >60 mL/min (ref >60)
GFR, EST NON AFRICAN AMERICAN: 54 mL/min — AB (ref >60)
GLUCOSE: 342 mg/dL — AB (ref 65–99)
Potassium: 4.4 mmol/L (ref 3.5–5.1)
Sodium: 137 mmol/L (ref 135–145)

## 2016-12-17 LAB — CBC WITH DIFFERENTIAL/PLATELET
BASOS ABS: 0.1 10*3/uL (ref 0.0–0.1)
BASOS PCT: 5 %
EOS ABS: 0 10*3/uL (ref 0.0–0.7)
Eosinophils Relative: 0 %
HEMATOCRIT: 32.9 % — AB (ref 39.0–52.0)
Hemoglobin: 11.3 g/dL — ABNORMAL LOW (ref 13.0–17.0)
LYMPHS ABS: 1 10*3/uL (ref 0.7–4.0)
Lymphocytes Relative: 38 %
MCH: 27.3 pg (ref 26.0–34.0)
MCHC: 34.3 g/dL (ref 30.0–36.0)
MCV: 79.5 fL (ref 78.0–100.0)
Monocytes Absolute: 0.5 10*3/uL (ref 0.1–1.0)
Monocytes Relative: 20 %
NEUTROS ABS: 1 10*3/uL — AB (ref 1.7–7.7)
Neutrophils Relative %: 37 %
PLATELETS: 174 10*3/uL (ref 150–400)
RBC: 4.14 MIL/uL — ABNORMAL LOW (ref 4.22–5.81)
RDW: 14.6 % (ref 11.5–15.5)
WBC: 2.6 10*3/uL — ABNORMAL LOW (ref 4.0–10.5)

## 2016-12-17 LAB — GLUCOSE, CAPILLARY
GLUCOSE-CAPILLARY: 292 mg/dL — AB (ref 65–99)
GLUCOSE-CAPILLARY: 310 mg/dL — AB (ref 65–99)
Glucose-Capillary: 214 mg/dL — ABNORMAL HIGH (ref 65–99)
Glucose-Capillary: 231 mg/dL — ABNORMAL HIGH (ref 65–99)
Glucose-Capillary: 346 mg/dL — ABNORMAL HIGH (ref 65–99)
Glucose-Capillary: 455 mg/dL — ABNORMAL HIGH (ref 65–99)

## 2016-12-17 LAB — FERRITIN: Ferritin: 933 ng/mL — ABNORMAL HIGH (ref 24–336)

## 2016-12-17 LAB — MRSA PCR SCREENING: MRSA by PCR: NEGATIVE

## 2016-12-17 LAB — LACTIC ACID, PLASMA: Lactic Acid, Venous: 2.3 mmol/L (ref 0.5–1.9)

## 2016-12-17 LAB — PATHOLOGIST SMEAR REVIEW

## 2016-12-17 MED ORDER — ASPIRIN 81 MG PO CHEW
324.0000 mg | CHEWABLE_TABLET | ORAL | Status: DC
Start: 2016-12-17 — End: 2016-12-17

## 2016-12-17 MED ORDER — SODIUM CHLORIDE 0.9 % IV SOLN
250.0000 mL | INTRAVENOUS | Status: DC | PRN
  Administered 2016-12-20: 1000 mL via INTRAVENOUS
  Administered 2016-12-22: 250 mL via INTRAVENOUS

## 2016-12-17 MED ORDER — FAMOTIDINE 20 MG PO TABS
20.0000 mg | ORAL_TABLET | Freq: Two times a day (BID) | ORAL | Status: DC
  Administered 2016-12-17 – 2016-12-18 (×4): 20 mg via ORAL
  Filled 2016-12-17 (×5): qty 1

## 2016-12-17 MED ORDER — INSULIN ASPART 100 UNIT/ML ~~LOC~~ SOLN
0.0000 [IU] | Freq: Every day | SUBCUTANEOUS | Status: DC
  Administered 2016-12-17: 2 [IU] via SUBCUTANEOUS

## 2016-12-17 MED ORDER — INSULIN GLARGINE 100 UNIT/ML ~~LOC~~ SOLN
15.0000 [IU] | Freq: Every day | SUBCUTANEOUS | Status: DC
  Filled 2016-12-17: qty 0.15

## 2016-12-17 MED ORDER — ALBUTEROL SULFATE (2.5 MG/3ML) 0.083% IN NEBU: 2.5000 mg | INHALATION_SOLUTION | RESPIRATORY_TRACT | Status: DC | PRN

## 2016-12-17 MED ORDER — INSULIN ASPART 100 UNIT/ML ~~LOC~~ SOLN
12.0000 [IU] | Freq: Once | SUBCUTANEOUS | Status: AC
  Administered 2016-12-17: 12 [IU] via SUBCUTANEOUS

## 2016-12-17 MED ORDER — INSULIN GLARGINE 100 UNIT/ML ~~LOC~~ SOLN
5.0000 [IU] | Freq: Every day | SUBCUTANEOUS | Status: DC
  Administered 2016-12-17: 5 [IU] via SUBCUTANEOUS
  Filled 2016-12-17: qty 0.05

## 2016-12-17 MED ORDER — INSULIN ASPART 100 UNIT/ML ~~LOC~~ SOLN
0.0000 [IU] | Freq: Three times a day (TID) | SUBCUTANEOUS | Status: DC
  Administered 2016-12-17: 5 [IU] via SUBCUTANEOUS
  Administered 2016-12-17: 8 [IU] via SUBCUTANEOUS
  Administered 2016-12-17: 11 [IU] via SUBCUTANEOUS
  Administered 2016-12-18 (×3): 5 [IU] via SUBCUTANEOUS

## 2016-12-17 MED ORDER — CHLORPROMAZINE HCL 25 MG/ML IJ SOLN
25.0000 mg | Freq: Three times a day (TID) | INTRAMUSCULAR | Status: DC | PRN
  Administered 2016-12-17 – 2016-12-21 (×6): 25 mg via INTRAMUSCULAR
  Filled 2016-12-17 (×10): qty 1

## 2016-12-17 MED ORDER — VANCOMYCIN HCL 10 G IV SOLR
1250.0000 mg | Freq: Two times a day (BID) | INTRAVENOUS | Status: DC
  Administered 2016-12-17: 1250 mg via INTRAVENOUS
  Filled 2016-12-17 (×2): qty 1250

## 2016-12-17 MED ORDER — ACETAMINOPHEN 325 MG PO TABS
650.0000 mg | ORAL_TABLET | ORAL | Status: DC | PRN
  Administered 2016-12-17 – 2016-12-19 (×4): 650 mg via ORAL
  Filled 2016-12-17 (×4): qty 2

## 2016-12-17 MED ORDER — INSULIN ASPART 100 UNIT/ML ~~LOC~~ SOLN
7.0000 [IU] | Freq: Three times a day (TID) | SUBCUTANEOUS | Status: DC
  Administered 2016-12-17 – 2016-12-18 (×5): 7 [IU] via SUBCUTANEOUS

## 2016-12-17 MED ORDER — VANCOMYCIN HCL 10 G IV SOLR
2000.0000 mg | Freq: Once | INTRAVENOUS | Status: AC
  Administered 2016-12-17: 2000 mg via INTRAVENOUS
  Filled 2016-12-17: qty 2000

## 2016-12-17 MED ORDER — CEFTRIAXONE SODIUM 2 G IJ SOLR
2.0000 g | INTRAMUSCULAR | Status: AC
  Administered 2016-12-17 – 2016-12-22 (×6): 2 g via INTRAVENOUS
  Filled 2016-12-17 (×6): qty 2

## 2016-12-17 MED ORDER — INSULIN GLARGINE 100 UNIT/ML ~~LOC~~ SOLN
10.0000 [IU] | Freq: Once | SUBCUTANEOUS | Status: AC
  Administered 2016-12-17: 10 [IU] via SUBCUTANEOUS
  Filled 2016-12-17: qty 0.1

## 2016-12-17 MED ORDER — ASPIRIN 300 MG RE SUPP: 300.0000 mg | RECTAL | Status: DC

## 2016-12-17 MED ORDER — HEPARIN SODIUM (PORCINE) 5000 UNIT/ML IJ SOLN
5000.0000 [IU] | Freq: Three times a day (TID) | INTRAMUSCULAR | Status: DC
  Administered 2016-12-17 – 2016-12-28 (×33): 5000 [IU] via SUBCUTANEOUS
  Filled 2016-12-17 (×36): qty 1

## 2016-12-17 NOTE — Progress Notes (Signed)
Pharmacy Antibiotic Note  Neil Murphy is a 56 y.o. male admitted on 12/16/2016 with CAP. CXR shows multifocal infiltrates. Pharmacy has been consulted for Vancomycin dosing.  Flu pos 1/5, did not receive Tamiflu. WBC 2.6, lactic acid >4 at admission, 2.3 today.  Due to multifocal infiltrates and the fact that staph commonly the flu, we will cover for the possibility of MRSA  Plan: Loading Dose: Vancomycin 2,000 mg IV x1  Maintenance: Vancomycin 1,250 mg IV every 12 ours. Goal trough 15-20 mcg/mL   Monitor renal fxn, F/U Vanc trough at [facility].   Height: 5\' 8"  (172.7 cm) Weight: 205 lb 0.4 oz (93 kg) IBW/kg (Calculated) : 68.4  Temp (24hrs), Avg:98.7 F (37.1 C), Min:98.1 F (36.7 C), Max:99.3 F (37.4 C)   Recent Labs Lab 12/16/16 1924 12/16/16 1941 12/16/16 2211 12/17/16 0458  WBC 1.6*  --   --  2.6*  CREATININE 1.93*  --   --  1.43*  LATICACIDVEN  --  4.87* 3.88* 2.3*    Estimated Creatinine Clearance: 65.3 mL/min (by C-G formula based on SCr of 1.43 mg/dL (H)).    No Known Allergies  Antimicrobials this admission: 1/7 Ceftriaxone >>  1/7 Azithromycin >>  1/8 Vancomycin >>   Microbiology results: 1/7 BCx: pending 1/7 UCx: pending  1/8 Sputum: sent  1/8 MRSA PCR: negative  Thank you for allowing pharmacy to be a part of this patient's care.  Grayling Congress, PharmD Candidate  12/17/2016 11:33 AM  I discussed / reviewed the pharmacy note by Apolonio Schneiders, PharmD Candidate and I agree with the resident's findings and plans as documented.  Levester Fresh, PharmD, BCPS, BCCCP Clinical Pharmacist Clinical phone for 12/17/2016 from 7a-3:30p: [phone] If after 3:30p, please call main pharmacy at: [phone] 12/17/2016 12:31 PM

## 2016-12-17 NOTE — Progress Notes (Signed)
PHARMACY - PHYSICIAN COMMUNICATION CRITICAL VALUE ALERT - BLOOD CULTURE IDENTIFICATION (BCID)  Results for orders placed or performed during the hospital encounter of 12/16/16  Blood Culture ID Panel (Reflexed) (Collected: 12/16/2016  8:28 PM)  Result Value Ref Range   Enterococcus species NOT DETECTED NOT DETECTED   Listeria monocytogenes NOT DETECTED NOT DETECTED   Staphylococcus species NOT DETECTED NOT DETECTED   Staphylococcus aureus NOT DETECTED NOT DETECTED   Streptococcus species DETECTED (A) NOT DETECTED   Streptococcus agalactiae NOT DETECTED NOT DETECTED   Streptococcus pneumoniae DETECTED (A) NOT DETECTED   Streptococcus pyogenes NOT DETECTED NOT DETECTED   Acinetobacter baumannii NOT DETECTED NOT DETECTED   Enterobacteriaceae species NOT DETECTED NOT DETECTED   Enterobacter cloacae complex NOT DETECTED NOT DETECTED   Escherichia coli NOT DETECTED NOT DETECTED   Klebsiella oxytoca NOT DETECTED NOT DETECTED   Klebsiella pneumoniae NOT DETECTED NOT DETECTED   Proteus species NOT DETECTED NOT DETECTED   Serratia marcescens NOT DETECTED NOT DETECTED   Haemophilus influenzae NOT DETECTED NOT DETECTED   Neisseria meningitidis NOT DETECTED NOT DETECTED   Pseudomonas aeruginosa NOT DETECTED NOT DETECTED   Candida albicans NOT DETECTED NOT DETECTED   Candida glabrata NOT DETECTED NOT DETECTED   Candida krusei NOT DETECTED NOT DETECTED   Candida parapsilosis NOT DETECTED NOT DETECTED   Candida tropicalis NOT DETECTED NOT DETECTED    Name of physician (or Provider) Contacted: Dr. Lake Bells  Changes to prescribed antibiotics required: Increase ceftriaxone 2 grams IV every 24 hours. Consider stopping vancomycin soon.   Vincenza Hews, PharmD, BCPS 12/17/2016, 2:55 PM Pager: [phone]

## 2016-12-17 NOTE — Progress Notes (Signed)
[facility] PCCM  Chart reviewed, patient examined  Reports feeling badly initially on new year's eve, then had progressive dyspnea, cough with brown sputum production throughout the day.    Since coming into the hospital he feels better  Vitals:   12/17/16 0900 12/17/16 1000 12/17/16 1100 12/17/16 1216  BP: 119/79 115/68 137/85   Pulse: 98 93 (!) 101   Resp: (!) 25 (!) 27 (!) 22   Temp:    (!) 101.1 F (38.4 C)  TempSrc:    Oral  SpO2: 94% 96% 98%   Weight:      Height:       6L [place]  On exam Few crackles, normal respiratory effort Belly soft, nontender MSK: normal bulk and tone Neuro: awake, alert  CXR images: multi-lobar consolidation  Impression/plan:  Influenza: tamiflu CAP: high likelihood of this being MRSA pneumonia, add vanc to ceftriaxone and azithro, check resp culture  To floor, TRH  Roselie Awkward, MD [place] PCCM Pager: [phone] Cell: [phone] After 3pm or if no response, call [phone]

## 2016-12-17 NOTE — Progress Notes (Signed)
Inpatient Diabetes Program Recommendations  AACE/ADA: New Consensus Statement on Inpatient Glycemic Control (2015)  Target Ranges:  Prepandial:   less than 140 mg/dL      Peak postprandial:   less than 180 mg/dL (1-2 hours)      Critically ill patients:  140 - 180 mg/dL   Lab Results  Component Value Date   GLUCAP 310 (H) 12/17/2016    Review of Glycemic Control:  Results for GERNARD, SAUCERMAN (MRN [id number]) as of 12/17/2016 12:32  Ref. Range 12/17/2016 00:30 12/17/2016 03:46 12/17/2016 08:26 12/17/2016 12:15  Glucose-Capillary Latest Ref Range: 65 - 99 mg/dL 455 (H) 346 (H) 292 (H) 310 (H)   Diabetes history: Type 2 diabetes Outpatient Diabetes medications: Metformin 1000 mg bid Current orders for Inpatient glycemic control:  Novolog moderate tid with meal and HS, Lantus 15 units daily (first dose today), Novolog 7 units tid with meals  Inpatient Diabetes Program Recommendations:   Please order A1C to determine pre-hospitalization glycemic control.  Will follow.  Thanks, Adah Perl, RN, BC-ADM Inpatient Diabetes Coordinator Pager [phone] (8a-5p)

## 2016-12-17 NOTE — H&P (Signed)
PULMONARY / CRITICAL CARE MEDICINE   Name: Neil Murphy MRN: [id number] DOB: [birth year]    ADMISSION DATE:  12/16/2016 CONSULTATION DATE:  12/17/16  REFERRING MD:  EDP- [facility]   CHIEF COMPLAINT:  Shortness of breath  HISTORY OF PRESENT ILLNESS:   Neil Murphy is a 56yo man with PMHx of GERD who presented to [facility] ED on 12/16/16 with multiple URI complaints. He reports a 1 week hx of progressively worsening SOB, myalgias, sore throat, fevers, and a few day hx of a cough productive of rust-colored sputum. He reports he went to his PCP 2 days ago and tested positive for the flu. He denies receiving antibiotics. At [facility] he was found to have an elevated lactic acid of >4, new acute renal insufficiency, leukopenia of 1.6, and CXR with bilateral infiltrates. He was started on Ceftriaxone and Azithromycin. He was hypoxic at 86% and requiring 15 L oxygen so he was transferred to [facility] for further care.   PAST MEDICAL HISTORY :  He  has a past medical history of GERD (gastroesophageal reflux disease) and Phimosis. Reports hx type 2 DM.   PAST SURGICAL HISTORY: He  has a past surgical history that includes Circumcision (N/A, 04/07/2013).  No Known Allergies  No current facility-administered medications on file prior to encounter.    Current Outpatient Prescriptions on File Prior to Encounter  Medication Sig  . Multiple Vitamin (MULTIVITAMIN) tablet Take 1 tablet by mouth daily.    FAMILY HISTORY:  His has no family status information on file.    SOCIAL HISTORY: He  reports that he has never smoked. He has never used smokeless tobacco. He reports that he does not drink alcohol or use drugs.  REVIEW OF SYSTEMS:  Review of Systems  Constitutional: Positive for fever and malaise/fatigue.  HENT: Positive for congestion and sore throat.   Respiratory: Positive for cough, sputum production and shortness of breath. Negative for hemoptysis and  wheezing.   Cardiovascular: Negative for chest pain, orthopnea and leg swelling.  Gastrointestinal: Positive for constipation and heartburn. Negative for abdominal pain, blood in stool, diarrhea, melena, nausea and vomiting.  Genitourinary: Negative for dysuria, frequency, hematuria and urgency.  Musculoskeletal: Positive for myalgias.  Skin: Negative for rash.    SUBJECTIVE:  Reports feeling SOB and productive cough. He reports his myalgias, sore throat, and congestion have improved over the last few days.   VITAL SIGNS: BP 117/77   Pulse 90   Temp 98.1 F (36.7 C) (Oral)   Resp (!) 25   Ht 5\' 8"  (1.727 m)   Wt 200 lb (90.7 kg)   SpO2 96%   BMI 30.41 kg/m   HEMODYNAMICS:    VENTILATOR SETTINGS: FiO2 (%):  [40 %-50 %] 50 %  INTAKE / OUTPUT: No intake/output data recorded.  PHYSICAL EXAMINATION: General:  Middle aged man sitting up in bed, NAD Neuro:  Alert and oriented x 3, talking in complete sentences HEENT:  [place]/AT, EOMI, PERRL, pharynx mildly erythematous, mucus membranes dry Cardiovascular:  RRR, no m/g/r Lungs:  Decreased breath sounds at bases, crackles bilaterally R>L, breaths non-labored on 5 L via nasal cannula Abdomen:  BS+, soft, non-tender Musculoskeletal:  No peripheral edema, moves all extremities Skin:  Warm, dry  LABS:  BMET  Recent Labs Lab 12/16/16 1924  NA 128*  K 4.9  CL 94*  CO2 20*  BUN 57*  CREATININE 1.93*  GLUCOSE 499*    Electrolytes  Recent Labs Lab 12/16/16 1924  CALCIUM 9.3    CBC  Recent Labs Lab 12/16/16 1924  WBC 1.6*  HGB 12.8*  HCT 37.0*  PLT 169    Coag's No results for input(s): APTT, INR in the last 168 hours.  Sepsis Markers  Recent Labs Lab 12/16/16 1941 12/16/16 2211  LATICACIDVEN 4.87* 3.88*    ABG No results for input(s): PHART, PCO2ART, PO2ART in the last 168 hours.  Liver Enzymes  Recent Labs Lab 12/16/16 1924  AST 40  ALT 25  ALKPHOS 32*  BILITOT 1.3*  ALBUMIN 3.0*     Cardiac Enzymes  Recent Labs Lab 12/16/16 1924  TROPONINI <0.03    Glucose  Recent Labs Lab 12/17/16 0030  GLUCAP 455*    Imaging Dg Chest Port 1 View  Result Date: 12/16/2016 CLINICAL DATA:  Shortness of breath and fever EXAM: PORTABLE CHEST 1 VIEW COMPARISON:  January 31, 2007 FINDINGS: There is airspace consolidation throughout the left mid lower lung zone. There is patchy airspace opacity in the right mid lower lung zones, less than on the left. Lungs elsewhere are clear. Heart size and pulmonary vascularity are normal. No adenopathy. No bone lesions. IMPRESSION: Multifocal pneumonia, most severe in the left base. Heart size and pulmonary vascularity are normal. No adenopathy. Followup PA and lateral chest radiographs recommended in 3-4 weeks following trial of antibiotic therapy to ensure resolution and exclude underlying malignancy. Note that in the right lower lung region, one view areas of consolidation does have a somewhat nodular appearance. Follow-up imaging strongly advised with particular attention to the right lower lobe on subsequent examination. Electronically Signed   By: Lowella Grip III M.D.   On: 12/16/2016 19:55     STUDIES:  CXR 1/7>> multifocal PNA, most severe in left lung base  CULTURES: Blood cx 1/7>> Urine cx 1/7>>  ANTIBIOTICS: Ceftriaxone 1/7>> Azithromycin 1/7>>  SIGNIFICANT EVENTS: 1/7>> Presented to [facility] with URI symptoms found to have a multifocal PNA 1/8>> Transferred to [facility] for care  LINES/TUBES: None  DISCUSSION: Neil Murphy is a 56yo man with PMHx of type 2 DM and GERD presenting with URI symptoms found to have a multifocal PNA on CXR.   ASSESSMENT / PLAN:  PULMONARY A: CAP Reported positive flu test at PCP- symptoms started 7 days ago P:   Azithro and Ceftriaxone Oxygen therapy, titrate down as able to keep sats >92% Albuterol PRN wheezing, SOB Droplet precautions  CARDIOVASCULAR A:  No acute  issues P:  None  RENAL A:   AKI, unknown BL Cr Non-AG Metabolic acidosis  Lactic acidosis Pseudohyponatremia 2/2 hyperglycemia P:   NS @ 125 bmet in AM Tx hyperglycemia as below Repeat lactic acid  GASTROINTESTINAL A:   Hx GERD GI Prophylaxis  P:   Pepcid Carb mod diet   HEMATOLOGIC A:   Leukopenia Borderline microcytic anemia DVT PPx P:  CBC in AM Check HIV Check ferritin, although may be elevated in setting of acute illness Heparin SQ  INFECTIOUS A:   Sepsis 2/2 CAP P:   Follow up blood cx Abx as above Check HIV Repeat lactic acid  ENDOCRINE A:   HHS Type 2 DM   P:   Give Novolog 12 units now Moderate ISS CBGs 4 times daily with meals and bedtime NS @ 125 ml/hr  NEUROLOGIC A:   No acute issues P:   None   FAMILY  - Updates: Wife and patient updated at bedside   Likely transfer to [facility] in morning  Albin Felling, MD, MPH Internal Medicine Resident, PGY-III Pager: [phone] 12/17/2016, 12:46 AM

## 2016-12-18 ENCOUNTER — Inpatient Hospital Stay (HOSPITAL_COMMUNITY): Payer: 59

## 2016-12-18 DIAGNOSIS — R739 Hyperglycemia, unspecified: Secondary | ICD-10-CM

## 2016-12-18 DIAGNOSIS — J111 Influenza due to unidentified influenza virus with other respiratory manifestations: Secondary | ICD-10-CM

## 2016-12-18 DIAGNOSIS — R0902 Hypoxemia: Secondary | ICD-10-CM

## 2016-12-18 DIAGNOSIS — J9601 Acute respiratory failure with hypoxia: Secondary | ICD-10-CM

## 2016-12-18 DIAGNOSIS — E119 Type 2 diabetes mellitus without complications: Secondary | ICD-10-CM

## 2016-12-18 DIAGNOSIS — Z01818 Encounter for other preprocedural examination: Secondary | ICD-10-CM

## 2016-12-18 LAB — POCT I-STAT 3, ART BLOOD GAS (G3+)
ACID-BASE DEFICIT: 2 mmol/L (ref 0.0–2.0)
Acid-base deficit: 3 mmol/L — ABNORMAL HIGH (ref 0.0–2.0)
BICARBONATE: 23.3 mmol/L (ref 20.0–28.0)
Bicarbonate: 20.4 mmol/L (ref 20.0–28.0)
O2 SAT: 87 %
O2 Saturation: 98 %
TCO2: 21 mmol/L (ref 0–100)
TCO2: 25 mmol/L (ref 0–100)
pCO2 arterial: 32.9 mmHg (ref 32.0–48.0)
pCO2 arterial: 44 mmHg (ref 32.0–48.0)
pH, Arterial: 7.405 (ref 7.350–7.450)
pH, Arterial: 7.337 — ABNORMAL LOW (ref 7.350–7.450)
pO2, Arterial: 55 mmHg — ABNORMAL LOW (ref 83.0–108.0)
pO2, Arterial: 109 mmHg — ABNORMAL HIGH (ref 83.0–108.0)

## 2016-12-18 LAB — GLUCOSE, CAPILLARY
GLUCOSE-CAPILLARY: 226 mg/dL — AB (ref 65–99)
Glucose-Capillary: 226 mg/dL — ABNORMAL HIGH (ref 65–99)
Glucose-Capillary: 207 mg/dL — ABNORMAL HIGH (ref 65–99)
Glucose-Capillary: 147 mg/dL — ABNORMAL HIGH (ref 65–99)

## 2016-12-18 LAB — BASIC METABOLIC PANEL
ANION GAP: 7 (ref 5–15)
BUN: 22 mg/dL — ABNORMAL HIGH (ref 6–20)
CO2: 25 mmol/L (ref 22–32)
Calcium: 8.7 mg/dL — ABNORMAL LOW (ref 8.9–10.3)
Chloride: 108 mmol/L (ref 101–111)
Creatinine, Ser: 1.11 mg/dL (ref 0.61–1.24)
GFR calc Af Amer: >60 mL/min (ref >60)
Glucose, Bld: 195 mg/dL — ABNORMAL HIGH (ref 65–99)
POTASSIUM: 3.3 mmol/L — AB (ref 3.5–5.1)
SODIUM: 140 mmol/L (ref 135–145)

## 2016-12-18 LAB — URINE CULTURE: CULTURE: NO GROWTH

## 2016-12-18 LAB — TRIGLYCERIDES: TRIGLYCERIDES: 150 mg/dL — AB (ref <150)

## 2016-12-18 LAB — EXPECTORATED SPUTUM ASSESSMENT W REFEX TO RESP CULTURE

## 2016-12-18 LAB — EXPECTORATED SPUTUM ASSESSMENT W GRAM STAIN, RFLX TO RESP C: Special Requests: NORMAL

## 2016-12-18 MED ORDER — FENTANYL CITRATE (PF) 100 MCG/2ML IJ SOLN
50.0000 ug | Freq: Once | INTRAMUSCULAR | Status: AC
  Administered 2016-12-18: 50 ug via INTRAVENOUS

## 2016-12-18 MED ORDER — POTASSIUM CHLORIDE CRYS ER 20 MEQ PO TBCR
40.0000 meq | EXTENDED_RELEASE_TABLET | Freq: Once | ORAL | Status: AC
  Administered 2016-12-18: 40 meq via ORAL
  Filled 2016-12-18: qty 2

## 2016-12-18 MED ORDER — INSULIN ASPART 100 UNIT/ML ~~LOC~~ SOLN
0.0000 [IU] | SUBCUTANEOUS | Status: DC
  Administered 2016-12-18 – 2016-12-19 (×2): 2 [IU] via SUBCUTANEOUS
  Administered 2016-12-19: 3 [IU] via SUBCUTANEOUS
  Administered 2016-12-19: 2 [IU] via SUBCUTANEOUS

## 2016-12-18 MED ORDER — ORAL CARE MOUTH RINSE
15.0000 mL | Freq: Four times a day (QID) | OROMUCOSAL | Status: DC
  Administered 2016-12-18 – 2016-12-25 (×27): 15 mL via OROMUCOSAL

## 2016-12-18 MED ORDER — FENTANYL CITRATE (PF) 100 MCG/2ML IJ SOLN
INTRAMUSCULAR | Status: AC
  Administered 2016-12-18: 100 ug
  Filled 2016-12-18: qty 2

## 2016-12-18 MED ORDER — INSULIN GLARGINE 100 UNIT/ML ~~LOC~~ SOLN
20.0000 [IU] | Freq: Every day | SUBCUTANEOUS | Status: DC
  Administered 2016-12-19: 20 [IU] via SUBCUTANEOUS
  Filled 2016-12-18: qty 0.2

## 2016-12-18 MED ORDER — ALBUTEROL SULFATE (2.5 MG/3ML) 0.083% IN NEBU
2.5000 mg | INHALATION_SOLUTION | RESPIRATORY_TRACT | Status: DC | PRN
  Administered 2016-12-27: 2.5 mg via RESPIRATORY_TRACT
  Filled 2016-12-18: qty 3

## 2016-12-18 MED ORDER — FENTANYL BOLUS VIA INFUSION
50.0000 ug | INTRAVENOUS | Status: DC | PRN
  Administered 2016-12-18 (×4): 50 ug via INTRAVENOUS
  Filled 2016-12-18: qty 50

## 2016-12-18 MED ORDER — MIDAZOLAM HCL 2 MG/2ML IJ SOLN
INTRAMUSCULAR | Status: AC
  Administered 2016-12-18: 4 mg
  Filled 2016-12-18: qty 4

## 2016-12-18 MED ORDER — INSULIN GLARGINE 100 UNIT/ML ~~LOC~~ SOLN
25.0000 [IU] | Freq: Every day | SUBCUTANEOUS | Status: DC
  Administered 2016-12-18: 25 [IU] via SUBCUTANEOUS
  Filled 2016-12-18: qty 0.25

## 2016-12-18 MED ORDER — FENTANYL 2500MCG IN NS 250ML (10MCG/ML) PREMIX INFUSION
25.0000 ug/h | INTRAVENOUS | Status: DC
  Administered 2016-12-18: 250 ug/h via INTRAVENOUS
  Administered 2016-12-18: 50 ug/h via INTRAVENOUS
  Administered 2016-12-19: 300 ug/h via INTRAVENOUS
  Administered 2016-12-19: 30 ug/h via INTRAVENOUS
  Administered 2016-12-19: 50 ug/h via INTRAVENOUS
  Administered 2016-12-19: 250 ug/h via INTRAVENOUS
  Administered 2016-12-20: 300 ug/h via INTRAVENOUS
  Administered 2016-12-20 (×2): 25 ug/h via INTRAVENOUS
  Administered 2016-12-20: 300 ug/h via INTRAVENOUS
  Filled 2016-12-18 (×8): qty 250

## 2016-12-18 MED ORDER — ROCURONIUM BROMIDE 50 MG/5ML IV SOLN
85.0000 mg | Freq: Once | INTRAVENOUS | Status: AC
  Administered 2016-12-18: 85 mg via INTRAVENOUS

## 2016-12-18 MED ORDER — DOCUSATE SODIUM 50 MG/5ML PO LIQD
100.0000 mg | Freq: Two times a day (BID) | ORAL | Status: DC | PRN
  Administered 2016-12-20: 100 mg
  Filled 2016-12-18: qty 10

## 2016-12-18 MED ORDER — PROPOFOL 1000 MG/100ML IV EMUL
5.0000 ug/kg/min | INTRAVENOUS | Status: DC
  Administered 2016-12-18: 25 ug/kg/min via INTRAVENOUS
  Administered 2016-12-18: 5 ug/kg/min via INTRAVENOUS
  Administered 2016-12-19: 30 ug/kg/min via INTRAVENOUS
  Administered 2016-12-19 (×4): 50 ug/kg/min via INTRAVENOUS
  Administered 2016-12-19: 10 ug/kg/min via INTRAVENOUS
  Administered 2016-12-19: 35 ug/kg/min via INTRAVENOUS
  Administered 2016-12-20 (×2): 50 ug/kg/min via INTRAVENOUS
  Administered 2016-12-20: 10 ug/kg/min via INTRAVENOUS
  Administered 2016-12-20 – 2016-12-21 (×10): 50 ug/kg/min via INTRAVENOUS
  Administered 2016-12-22: 80 ug/kg/min via INTRAVENOUS
  Filled 2016-12-18 (×21): qty 100
  Filled 2016-12-18: qty 200
  Filled 2016-12-18 (×3): qty 100

## 2016-12-18 MED ORDER — CHLORHEXIDINE GLUCONATE 0.12% ORAL RINSE (MEDLINE KIT)
15.0000 mL | Freq: Two times a day (BID) | OROMUCOSAL | Status: DC
  Administered 2016-12-18 – 2016-12-19 (×2): 15 mL via OROMUCOSAL

## 2016-12-18 MED ORDER — ETOMIDATE 2 MG/ML IV SOLN
20.0000 mg | Freq: Once | INTRAVENOUS | Status: AC
  Administered 2016-12-18: 20 mg via INTRAVENOUS

## 2016-12-18 NOTE — Progress Notes (Signed)
[place] Progress Note Patient Name: Neil Murphy DOB: [birth year] MRN: [id number]   Date of Service  12/18/2016  HPI/Events of Note  Potassium 3.3. Creatinine 1.11. Patient taking oral medications.   eICU Interventions  KCl 40 mEq by mouth 1      Intervention Category Intermediate Interventions: Electrolyte abnormality - evaluation and management  Tera Partridge 12/18/2016, 3:45 AM

## 2016-12-18 NOTE — Procedures (Signed)
Intubation Procedure Note Neil Murphy [id number] 01/06/1961  Procedure: Intubation Indications: Respiratory insufficiency  Procedure Details Consent: Risks of procedure as well as the alternatives and risks of each were explained to the (patient/caregiver).  Consent for procedure obtained. Time Out: Verified patient identification, verified procedure, site/side was marked, verified correct patient position, special equipment/implants available, medications/allergies/relevent history reviewed, required imaging and test results available.  Performed  Drugs: 20mg  etomidate and 85mg  rocuronium Glidescope Grade I view 8.0 tube passed through cords under direct visualization Placement confirmed with bilateral breath sounds, positive EtCO2 change and smoke in tube  Evaluation Hemodynamic Status: BP stable throughout; O2 sats: transiently fell during during procedure Patient's Current Condition: stable Complications: No apparent complications Patient did tolerate procedure well. Chest X-ray ordered to verify placement.  CXR: pending.   Jacques Earthly, MD  Internal Medicine PGY-3 12/18/16 2:18 PM    Attending:  I performed this procedure  Roselie Awkward, MD [place] PCCM Pager: [phone] Cell: [phone] After 3pm or if no response, call [phone]

## 2016-12-18 NOTE — Progress Notes (Signed)
[place] Progress Note Patient Name: [name] DOB: [birth year] MRN: [id number]   Date of Service  12/18/2016  HPI/Events of Note  ABG on 100%/P 5 = 7.33/44.0/109.0/25.0.  eICU Interventions  Will increase PEEP to 10 cm H2O.     Intervention Category Major Interventions: Respiratory failure - evaluation and management;Hypoxemia - evaluation and management  Lysle Dingwall 12/18/2016, 5:44 PM

## 2016-12-18 NOTE — Progress Notes (Addendum)
[facility] PCCM  S: Feeling better; Hyperglycemia improved but persists, appetite improved  Past Medical History:  Diagnosis Date  . GERD (gastroesophageal reflux disease)    OCCASIONALLY  . Phimosis   . Type 2 diabetes mellitus ([place])      O: Vitals:   12/18/16 0600 12/18/16 0700 12/18/16 0800 12/18/16 0843  BP: 117/74  116/75   Pulse: 85 79 77   Resp: (!) 43 (!) 36 (!) 31   Temp:    98.6 F (37 C)  TempSrc:    Oral  SpO2: 96% 97% 97%   Weight:      Height:       HFNC 6LPM  Gen: well appearing HENT: OP clear,  neck supple PULM: few Crackles bases bilaterally, normal effort CV: RRR, no mgr, trace edema GI: BS+, soft, nontender Derm: no cyanosis or rash Psyche: normal mood and affect  CXR: multilobar infiltrates  CBC    Component Value Date/Time   WBC 2.6 (L) 12/17/2016 0458   RBC 4.14 (L) 12/17/2016 0458   HGB 11.3 (L) 12/17/2016 0458   HCT 32.9 (L) 12/17/2016 0458   PLT 174 12/17/2016 0458   MCV 79.5 12/17/2016 0458   MCH 27.3 12/17/2016 0458   MCHC 34.3 12/17/2016 0458   RDW 14.6 12/17/2016 0458   LYMPHSABS 1.0 12/17/2016 0458   MONOABS 0.5 12/17/2016 0458   EOSABS 0.0 12/17/2016 0458   BASOSABS 0.1 12/17/2016 0458   BMET    Component Value Date/Time   NA 140 12/18/2016 0232   K 3.3 (L) 12/18/2016 0232   CL 108 12/18/2016 0232   CO2 25 12/18/2016 0232   GLUCOSE 195 (H) 12/18/2016 0232   BUN 22 (H) 12/18/2016 0232   CREATININE 1.11 12/18/2016 0232   CALCIUM 8.7 (L) 12/18/2016 0232   GFRNONAA >60 12/18/2016 0232   GFRAA >60 12/18/2016 0232   Blood culture: strep pneumonia  Impression/Plan:  Severe CAP: narrow antibiotics to ceftriaxone 2gm IV daily, stop vanc Influenza: no role for tamiflu at this point Hyperglycemia: increase lantus to 25U daily, continue SSI carb modified diet Hypokalemia: replaced this morning  Acute respiratory failure with hypoxemia: continue O2 to maintain O2 saturation > 90%  Transfer to tele, TRH service  Roselie Awkward,  MD [place] PCCM Pager: [phone] Cell: [phone] After 3pm or if no response, call [phone]

## 2016-12-18 NOTE — Progress Notes (Signed)
[facility] PCCM  Called emergently to bedside for rapid change in respiratory status. This morning his oxygenation and work of breathing had improved, but this afternoon he had an abrupt change in both for the worse.  On exam Crackles bilaterally, tachypneic Confused  CXR this afternoon: worsening infiltrates ABG 7.405/32.9/55 on high flow oxygen  Impression/Plan: Severe CAP: worsening oxygenation, likely developing ARDS, will need intubation.  Discussed with patient today who agrees. Will intubated, place on conventional vent settings and follow ABG and CXR.    CC time 30 minutes  Roselie Awkward, MD [place] PCCM Pager: [phone] Cell: [phone] After 3pm or if no response, call [phone]

## 2016-12-19 ENCOUNTER — Inpatient Hospital Stay (HOSPITAL_COMMUNITY): Payer: 59

## 2016-12-19 DIAGNOSIS — J9601 Acute respiratory failure with hypoxia: Secondary | ICD-10-CM

## 2016-12-19 LAB — CBC
HEMATOCRIT: 29.2 % — AB (ref 39.0–52.0)
HEMOGLOBIN: 9.8 g/dL — AB (ref 13.0–17.0)
MCH: 27.5 pg (ref 26.0–34.0)
MCHC: 33.6 g/dL (ref 30.0–36.0)
MCV: 81.8 fL (ref 78.0–100.0)
Platelets: 153 10*3/uL (ref 150–400)
RBC: 3.57 MIL/uL — AB (ref 4.22–5.81)
RDW: 15.3 % (ref 11.5–15.5)
WBC: 12 10*3/uL — ABNORMAL HIGH (ref 4.0–10.5)

## 2016-12-19 LAB — GLUCOSE, CAPILLARY
GLUCOSE-CAPILLARY: 145 mg/dL — AB (ref 65–99)
GLUCOSE-CAPILLARY: 197 mg/dL — AB (ref 65–99)
GLUCOSE-CAPILLARY: 288 mg/dL — AB (ref 65–99)
Glucose-Capillary: 133 mg/dL — ABNORMAL HIGH (ref 65–99)
Glucose-Capillary: 155 mg/dL — ABNORMAL HIGH (ref 65–99)
Glucose-Capillary: 249 mg/dL — ABNORMAL HIGH (ref 65–99)
Glucose-Capillary: 328 mg/dL — ABNORMAL HIGH (ref 65–99)

## 2016-12-19 LAB — POCT I-STAT 3, ART BLOOD GAS (G3+)
ACID-BASE DEFICIT: 5 mmol/L — AB (ref 0.0–2.0)
Acid-base deficit: 7 mmol/L — ABNORMAL HIGH (ref 0.0–2.0)
BICARBONATE: 18.5 mmol/L — AB (ref 20.0–28.0)
BICARBONATE: 18.6 mmol/L — AB (ref 20.0–28.0)
O2 SAT: 93 %
O2 Saturation: 89 %
PCO2 ART: 39.5 mmHg (ref 32.0–48.0)
PO2 ART: 70 mmHg — AB (ref 83.0–108.0)
Patient temperature: 102.3
Patient temperature: 100.9
TCO2: 20 mmol/L (ref 0–100)
TCO2: 19 mmol/L (ref 0–100)
pCO2 arterial: 31.3 mmHg — ABNORMAL LOW (ref 32.0–48.0)
pH, Arterial: 7.288 — ABNORMAL LOW (ref 7.350–7.450)
pH, Arterial: 7.388 (ref 7.350–7.450)
pO2, Arterial: 70 mmHg — ABNORMAL LOW (ref 83.0–108.0)

## 2016-12-19 LAB — BASIC METABOLIC PANEL
ANION GAP: 6 (ref 5–15)
BUN: 20 mg/dL (ref 6–20)
CALCIUM: 8.2 mg/dL — AB (ref 8.9–10.3)
CHLORIDE: 114 mmol/L — AB (ref 101–111)
CO2: 21 mmol/L — AB (ref 22–32)
Creatinine, Ser: 1.11 mg/dL (ref 0.61–1.24)
GFR calc non Af Amer: >60 mL/min (ref >60)
Glucose, Bld: 166 mg/dL — ABNORMAL HIGH (ref 65–99)
POTASSIUM: 4 mmol/L (ref 3.5–5.1)
Sodium: 141 mmol/L (ref 135–145)

## 2016-12-19 LAB — CULTURE, BLOOD (ROUTINE X 2)

## 2016-12-19 LAB — MAGNESIUM: MAGNESIUM: 1.9 mg/dL (ref 1.7–2.4)

## 2016-12-19 LAB — PHOSPHORUS: Phosphorus: 1.6 mg/dL — ABNORMAL LOW (ref 2.5–4.6)

## 2016-12-19 MED ORDER — PRO-STAT SUGAR FREE PO LIQD
30.0000 mL | Freq: Two times a day (BID) | ORAL | Status: DC
  Administered 2016-12-19: 30 mL
  Filled 2016-12-19: qty 30

## 2016-12-19 MED ORDER — FAMOTIDINE 40 MG/5ML PO SUSR
20.0000 mg | Freq: Every day | ORAL | Status: DC
  Administered 2016-12-19 – 2016-12-24 (×6): 20 mg via ORAL
  Filled 2016-12-19 (×6): qty 2.5

## 2016-12-19 MED ORDER — DEXTROSE 5 % IV SOLN
20.0000 mmol | Freq: Once | INTRAVENOUS | Status: AC
  Administered 2016-12-19: 20 mmol via INTRAVENOUS
  Filled 2016-12-19: qty 6.67

## 2016-12-19 MED ORDER — VITAL HIGH PROTEIN PO LIQD
1000.0000 mL | ORAL | Status: DC
  Administered 2016-12-19: 1000 mL

## 2016-12-19 MED ORDER — ORAL CARE MOUTH RINSE
15.0000 mL | Freq: Four times a day (QID) | OROMUCOSAL | Status: DC
  Administered 2016-12-19 – 2016-12-20 (×2): 15 mL via OROMUCOSAL

## 2016-12-19 MED ORDER — PRO-STAT SUGAR FREE PO LIQD
60.0000 mL | Freq: Four times a day (QID) | ORAL | Status: DC
  Administered 2016-12-19 – 2016-12-25 (×23): 60 mL
  Filled 2016-12-19 (×30): qty 60

## 2016-12-19 MED ORDER — FUROSEMIDE 10 MG/ML IJ SOLN
40.0000 mg | Freq: Two times a day (BID) | INTRAMUSCULAR | Status: DC
  Filled 2016-12-19: qty 4

## 2016-12-19 MED ORDER — INSULIN GLARGINE 100 UNIT/ML ~~LOC~~ SOLN
30.0000 [IU] | Freq: Every day | SUBCUTANEOUS | Status: DC
  Administered 2016-12-20 – 2016-12-21 (×2): 30 [IU] via SUBCUTANEOUS
  Filled 2016-12-19 (×2): qty 0.3

## 2016-12-19 MED ORDER — CHLORHEXIDINE GLUCONATE 0.12% ORAL RINSE (MEDLINE KIT)
15.0000 mL | Freq: Two times a day (BID) | OROMUCOSAL | Status: DC
  Administered 2016-12-19 – 2016-12-25 (×12): 15 mL via OROMUCOSAL

## 2016-12-19 MED ORDER — INSULIN ASPART 100 UNIT/ML ~~LOC~~ SOLN
0.0000 [IU] | SUBCUTANEOUS | Status: DC
  Administered 2016-12-19: 3 [IU] via SUBCUTANEOUS
  Administered 2016-12-19: 8 [IU] via SUBCUTANEOUS

## 2016-12-19 MED ORDER — ACETAMINOPHEN 160 MG/5ML PO SOLN
650.0000 mg | ORAL | Status: DC | PRN
  Administered 2016-12-19 – 2016-12-24 (×13): 650 mg via ORAL
  Filled 2016-12-19 (×14): qty 20.3

## 2016-12-19 MED ORDER — VITAL HIGH PROTEIN PO LIQD
1000.0000 mL | ORAL | Status: DC
  Administered 2016-12-20 – 2016-12-21 (×2): 1000 mL
  Administered 2016-12-22: 20:00:00
  Administered 2016-12-22 – 2016-12-24 (×3): 1000 mL

## 2016-12-19 MED ORDER — FUROSEMIDE 10 MG/ML IJ SOLN
40.0000 mg | Freq: Two times a day (BID) | INTRAMUSCULAR | Status: DC
  Administered 2016-12-19 – 2016-12-20 (×3): 40 mg via INTRAVENOUS
  Filled 2016-12-19 (×4): qty 4

## 2016-12-19 MED ORDER — INSULIN ASPART 100 UNIT/ML ~~LOC~~ SOLN
0.0000 [IU] | SUBCUTANEOUS | Status: DC
  Administered 2016-12-19: 15 [IU] via SUBCUTANEOUS
  Administered 2016-12-20: 7 [IU] via SUBCUTANEOUS
  Administered 2016-12-20: 11 [IU] via SUBCUTANEOUS
  Administered 2016-12-20: 7 [IU] via SUBCUTANEOUS
  Administered 2016-12-20: 11 [IU] via SUBCUTANEOUS
  Administered 2016-12-20: 7 [IU] via SUBCUTANEOUS
  Administered 2016-12-20: 11 [IU] via SUBCUTANEOUS
  Administered 2016-12-21: 7 [IU] via SUBCUTANEOUS
  Administered 2016-12-21: 4 [IU] via SUBCUTANEOUS
  Administered 2016-12-21: 7 [IU] via SUBCUTANEOUS

## 2016-12-19 MED ORDER — INSULIN GLARGINE 100 UNIT/ML ~~LOC~~ SOLN
10.0000 [IU] | Freq: Once | SUBCUTANEOUS | Status: AC
  Administered 2016-12-19: 10 [IU] via SUBCUTANEOUS
  Filled 2016-12-19: qty 0.1

## 2016-12-19 NOTE — Progress Notes (Signed)
[facility] PCCM  Subjective: Yesterday patient had worsening respiratory status. He was found to be tachypneic, confused. ABG showed pH of 7.4, pCO2 of 32.9 and pO2 of 55 on high flow oxygen. Due to worsening oxygenatino and likely developing ARDS, patient was intubated.   Past Medical History:  Diagnosis Date  . GERD (gastroesophageal reflux disease)    OCCASIONALLY  . Phimosis   . Type 2 diabetes mellitus ([facility])      O: Vitals:   12/19/16 0630 12/19/16 0700 12/19/16 0720 12/19/16 0800  BP: 138/76 140/79  134/79  Pulse: 62 63  69  Resp: (!) 24 (!) 24  (!) 24  Temp:   (!) 102.4 F (39.1 C)   TempSrc:   Oral   SpO2: 100% 100%  100%  Weight:      Height:        Gen: Intubated, sedated HENT: Intubated,  neck supple PULM: Crackles in bilateral bases R > L CV: RRR, no MRG  GI: BS+, soft, nontender  Ext: No LEE b/l  Neuro: Intubated and sedated, withdraws to pain    CBC    Component Value Date/Time   WBC 12.0 (H) 12/19/2016 0334   RBC 3.57 (L) 12/19/2016 0334   HGB 9.8 (L) 12/19/2016 0334   HCT 29.2 (L) 12/19/2016 0334   PLT 153 12/19/2016 0334   MCV 81.8 12/19/2016 0334   MCH 27.5 12/19/2016 0334   MCHC 33.6 12/19/2016 0334   RDW 15.3 12/19/2016 0334   LYMPHSABS 1.0 12/17/2016 0458   MONOABS 0.5 12/17/2016 0458   EOSABS 0.0 12/17/2016 0458   BASOSABS 0.1 12/17/2016 0458   BMET    Component Value Date/Time   NA 141 12/19/2016 0334   K 4.0 12/19/2016 0334   CL 114 (H) 12/19/2016 0334   CO2 21 (L) 12/19/2016 0334   GLUCOSE 166 (H) 12/19/2016 0334   BUN 20 12/19/2016 0334   CREATININE 1.11 12/19/2016 0334   CALCIUM 8.2 (L) 12/19/2016 0334   GFRNONAA >60 12/19/2016 0334   GFRAA >60 12/19/2016 0334   Culture: BCx 1/7 : strep pneumonia UCx 1/7 >> No growth Expectorated Cx 1/8 >> normal Respiratory Cx 1/8 >>  Lines/Tubes: ETT 1/9 >> OGT 1/9 >> Foley 1/10 >>  Impression/Plan: Mr. Duhart is a 56 yo male presenting with acute respiratory failure after recent  influenza infection complicated by severe CAP, requiring intubation.  ASSESSMENT / PLAN:  PULMONARY A: Acute Respiratory Failure with Hypoxemia 2/2 Severe CAP and Possible ARDS: Patient intubated yesterday for respiratory distress. Abx narrowed to Ceftriaxone yesterday. Off Vanc. Vent settings 550/70/14/10. On propofol and fentanyl for sedation.  CXR 1/9 > Intubated, endotracheal tube tip in good position. Confluent bilateral pulmonary opacity suspicious for bilateral pneumonia with superimposed lower lobe collapse or consolidation. No large pleural effusion. P:   Ceftriaxone and Azithromycin Off Vanc  Vent settings 550/70/14/10  CARDIOVASCULAR A:  No acute issues P:  Monitor  RENAL A:   No acute issues P:   Trend BMET  Place foley   GASTROINTESTINAL A:   No acute issues P:   Famotidine Start TF  HEMATOLOGIC A:   No acute issues P:  On Heparin Monitor CBC  INFECTIOUS  A:   Severe CAP P:   On Ceftriaxone and Azithromycin Vanc stopped 1/9  ENDOCRINE A:   T2DM P:   CBGs  NEUROLOGIC A:   Sedated P:    On Fentanyl and Propofol  [name], DO PGY-3 Internal Medicine Resident Pager # [phone] 12/19/2016  9:03 AM

## 2016-12-19 NOTE — Progress Notes (Addendum)
Pt started on ARDS protocol per Dr. Lake Bells.  Plateau Pressure noted at 20. VT left at 8cc/KG/IBW per ARDSNet Protocol.    Attempted to get sputum sample and had minimal secretions returned.  Not enough obtained for a sample.  Will attempt again at a later time.

## 2016-12-19 NOTE — Progress Notes (Addendum)
Initial Nutrition Assessment  DOCUMENTATION CODES:   Obesity unspecified  INTERVENTION:    Vital High Protein at 10 ml/h with Prostat 60 ml QID to provide 1040 kcal, 141 gm protein, 201 ml free water daily.  Total intake with TF + propofol = 1486 kcal.  NUTRITION DIAGNOSIS:   Inadequate oral intake related to inability to eat as evidenced by NPO status.  GOAL:   Provide needs based on ASPEN/SCCM guidelines  MONITOR:   Vent status, Labs, TF tolerance, I & O's  REASON FOR ASSESSMENT:   Consult Enteral/tube feeding initiation and management  ASSESSMENT:   56 yo male presenting with acute respiratory failure after recent influenza infection complicated by severe CAP, requiring intubation.  Discussed patient in ICU rounds and with RN today. On ARDS protocol. Weight up with I/O 6 L + Received MD Consult for TF initiation and management. OGT in place. Patient is currently intubated on ventilator support MV: 13.4 L/min Temp (24hrs), Avg:100.9 F (38.3 C), Min:98.3 F (36.8 C), Max:103 F (39.4 C)  Propofol: 16.9 ml/hr providing 446 kcal per day  Labs reviewed: phosphorus 1.6 CBG's: 155-197 Medications reviewed and include lasix, sodium phosphate, propofol   Diet Order:  Diet NPO time specified  Skin:  Reviewed, no issues  Last BM:  1/9  Height:   Ht Readings from Last 1 Encounters:  12/17/16 5\' 8"  (1.727 m)    Weight:   Wt Readings from Last 1 Encounters:  12/19/16 214 lb 15.2 oz (97.5 kg)   Admission weight 200 lb (90.7 kg) (BMI=30.5)  Ideal Body Weight:  70 kg  BMI:  Body mass index is 32.68 kg/m.  Estimated Nutritional Needs:   Kcal:  [id number]  Protein:  140 gm  Fluid:  2 L  EDUCATION NEEDS:   No education needs identified at this time  Molli Barrows, [place], [place], [place] Pager [phone] After Hours Pager [phone]

## 2016-12-20 ENCOUNTER — Inpatient Hospital Stay (HOSPITAL_COMMUNITY): Payer: 59

## 2016-12-20 DIAGNOSIS — J80 Acute respiratory distress syndrome: Secondary | ICD-10-CM

## 2016-12-20 DIAGNOSIS — R0603 Acute respiratory distress: Secondary | ICD-10-CM

## 2016-12-20 LAB — BLOOD GAS, ARTERIAL
Acid-base deficit: 1.1 mmol/L (ref 0.0–2.0)
Bicarbonate: 21.7 mmol/L (ref 20.0–28.0)
DRAWN BY: 40415
FIO2: 60
O2 SAT: 95.4 %
PCO2 ART: 29.1 mmHg — AB (ref 32.0–48.0)
PEEP: 10 cmH2O
PH ART: 7.49 — AB (ref 7.350–7.450)
Patient temperature: 100
RATE: 24 resp/min
VT: 550 mL
pO2, Arterial: 76.7 mmHg — ABNORMAL LOW (ref 83.0–108.0)

## 2016-12-20 LAB — GLUCOSE, CAPILLARY
GLUCOSE-CAPILLARY: 277 mg/dL — AB (ref 65–99)
GLUCOSE-CAPILLARY: 247 mg/dL — AB (ref 65–99)
Glucose-Capillary: 269 mg/dL — ABNORMAL HIGH (ref 65–99)
Glucose-Capillary: 290 mg/dL — ABNORMAL HIGH (ref 65–99)
Glucose-Capillary: 218 mg/dL — ABNORMAL HIGH (ref 65–99)

## 2016-12-20 LAB — ECHOCARDIOGRAM COMPLETE
HEIGHTINCHES: 68 in
Weight: 3432.12 oz

## 2016-12-20 LAB — BASIC METABOLIC PANEL
ANION GAP: 8 (ref 5–15)
BUN: 26 mg/dL — AB (ref 6–20)
CALCIUM: 8.2 mg/dL — AB (ref 8.9–10.3)
CO2: 22 mmol/L (ref 22–32)
Chloride: 113 mmol/L — ABNORMAL HIGH (ref 101–111)
Creatinine, Ser: 1.19 mg/dL (ref 0.61–1.24)
GFR calc Af Amer: >60 mL/min (ref >60)
GLUCOSE: 303 mg/dL — AB (ref 65–99)
POTASSIUM: 3.6 mmol/L (ref 3.5–5.1)
SODIUM: 143 mmol/L (ref 135–145)

## 2016-12-20 LAB — CULTURE, RESPIRATORY W GRAM STAIN
Culture: NORMAL
Special Requests: NORMAL

## 2016-12-20 LAB — CBC
HCT: 28.1 % — ABNORMAL LOW (ref 39.0–52.0)
Hemoglobin: 9.5 g/dL — ABNORMAL LOW (ref 13.0–17.0)
MCH: 27.6 pg (ref 26.0–34.0)
MCHC: 33.8 g/dL (ref 30.0–36.0)
MCV: 81.7 fL (ref 78.0–100.0)
Platelets: 145 10*3/uL — ABNORMAL LOW (ref 150–400)
RBC: 3.44 MIL/uL — ABNORMAL LOW (ref 4.22–5.81)
RDW: 15.7 % — AB (ref 11.5–15.5)
WBC: 14.1 10*3/uL — ABNORMAL HIGH (ref 4.0–10.5)

## 2016-12-20 LAB — MAGNESIUM: Magnesium: 1.9 mg/dL (ref 1.7–2.4)

## 2016-12-20 LAB — PHOSPHORUS: PHOSPHORUS: 2.5 mg/dL (ref 2.5–4.6)

## 2016-12-20 MED ORDER — INSULIN ASPART 100 UNIT/ML ~~LOC~~ SOLN
3.0000 [IU] | SUBCUTANEOUS | Status: DC
  Administered 2016-12-20 – 2016-12-21 (×6): 3 [IU] via SUBCUTANEOUS

## 2016-12-20 MED ORDER — SENNOSIDES 8.8 MG/5ML PO SYRP
5.0000 mL | ORAL_SOLUTION | Freq: Every day | ORAL | Status: DC | PRN
  Administered 2016-12-20: 5 mL via ORAL
  Filled 2016-12-20 (×2): qty 5

## 2016-12-20 MED ORDER — SODIUM CHLORIDE 0.9 % IV SOLN
25.0000 ug/h | INTRAVENOUS | Status: DC
  Administered 2016-12-20: 300 ug/h via INTRAVENOUS
  Administered 2016-12-21 – 2016-12-22 (×4): 400 ug/h via INTRAVENOUS
  Administered 2016-12-22 – 2016-12-23 (×2): 350 ug/h via INTRAVENOUS
  Administered 2016-12-23: 400 ug/h via INTRAVENOUS
  Administered 2016-12-24: 200 ug/h via INTRAVENOUS
  Administered 2016-12-24: 100 ug/h via INTRAVENOUS
  Filled 2016-12-20 (×12): qty 50

## 2016-12-20 MED ORDER — INSULIN ASPART 100 UNIT/ML ~~LOC~~ SOLN: 3.0000 [IU] | SUBCUTANEOUS | Status: DC

## 2016-12-20 MED ORDER — POTASSIUM CHLORIDE 20 MEQ PO PACK
40.0000 meq | PACK | Freq: Two times a day (BID) | ORAL | Status: DC
  Filled 2016-12-20: qty 2

## 2016-12-20 MED ORDER — FUROSEMIDE 10 MG/ML IJ SOLN
40.0000 mg | Freq: Four times a day (QID) | INTRAMUSCULAR | Status: AC
  Administered 2016-12-20 – 2016-12-21 (×3): 40 mg via INTRAVENOUS
  Filled 2016-12-20 (×5): qty 4

## 2016-12-20 MED ORDER — SENNA 8.6 MG PO TABS
1.0000 | ORAL_TABLET | Freq: Every day | ORAL | Status: DC | PRN
Start: 2016-12-20 — End: 2016-12-20
  Filled 2016-12-20: qty 1

## 2016-12-20 MED ORDER — POTASSIUM CHLORIDE 20 MEQ/15ML (10%) PO SOLN
40.0000 meq | Freq: Two times a day (BID) | ORAL | Status: AC
  Administered 2016-12-20 (×2): 40 meq via ORAL
  Filled 2016-12-20 (×3): qty 30

## 2016-12-20 NOTE — Progress Notes (Signed)
Tylenol per tube administered for fever

## 2016-12-20 NOTE — Progress Notes (Signed)
Pharmacy Antibiotic Note  Case Neil Murphy is a 56 y.o. male admitted on 12/16/2016 with severe CAP and possible ARDS.  Flu pos 1/5, did not receive Tamiflu. WBC 14, LAC>4 at admission, 2.3. 101.7 tmp overnight  Plan: This patient's current antibiotics will be continued without adjustments. Total duration of antibiotics 7 days. Stop date scheduled 1/14.   Height: 5\' 8"  (172.7 cm) Weight: 214 lb 8.1 oz (97.3 kg) IBW/kg (Calculated) : 68.4  Temp (24hrs), Avg:100.6 F (38.1 C), Min:99.3 F (37.4 C), Max:101.3 F (38.5 C)   Recent Labs Lab 12/16/16 1924 12/16/16 1941 12/16/16 2211 12/17/16 0458 12/18/16 0232 12/19/16 0334 12/20/16 0243  WBC 1.6*  --   --  2.6*  --  12.0* 14.1*  CREATININE 1.93*  --   --  1.43* 1.11 1.11 1.19  LATICACIDVEN  --  4.87* 3.88* 2.3*  --   --   --     Estimated Creatinine Clearance: 80.3 mL/min (by C-G formula based on SCr of 1.19 mg/dL).    No Known Allergies  Antimicrobials this admission: 1/7 Ceftriaxone >> (1/14) 1/7 Azithromycin >> (1/14) 1/8 Vancomycin>> 1/9   Microbiology results: 1/7 BCx: strep pna  1/7 UCx: pending  1/8 Sputum: sent  1/8 MRSA PCR: neg  Thank you for allowing pharmacy to be a part of this patient's care.  Grayling Congress, PharmD Candidate  12/20/2016 1:24 PM

## 2016-12-20 NOTE — Progress Notes (Signed)
[facility] PCCM  Subjective: No acute events overnight. Continues to be intubated, now on ARDS protocol for bilateral infiltrates.   Past Medical History:  Diagnosis Date  . GERD (gastroesophageal reflux disease)    OCCASIONALLY  . Phimosis   . Type 2 diabetes mellitus ([place])      O: Vitals:   12/20/16 0500 12/20/16 0530 12/20/16 0600 12/20/16 0700  BP: 117/61 (!) 105/54 (!) 108/57 (!) 104/54  Pulse: 66 67 61 61  Resp: (!) 22 (!) 24 (!) 24 (!) 24  Temp:      TempSrc:      SpO2: 96% 96% 97% 97%  Weight: 214 lb 8.1 oz (97.3 kg)     Height:       Gen: Intubated, sedated HENT: Intubated,  neck supple PULM: Crackles in bilateral bases R > L CV: RRR, no MRG  GI: BS quit, mildly distended  Ext: Trace LEE b/l  Neuro: Intubated and sedated  CBC    Component Value Date/Time   WBC 14.1 (H) 12/20/2016 0243   RBC 3.44 (L) 12/20/2016 0243   HGB 9.5 (L) 12/20/2016 0243   HCT 28.1 (L) 12/20/2016 0243   PLT 145 (L) 12/20/2016 0243   MCV 81.7 12/20/2016 0243   MCH 27.6 12/20/2016 0243   MCHC 33.8 12/20/2016 0243   RDW 15.7 (H) 12/20/2016 0243   LYMPHSABS 1.0 12/17/2016 0458   MONOABS 0.5 12/17/2016 0458   EOSABS 0.0 12/17/2016 0458   BASOSABS 0.1 12/17/2016 0458   BMET    Component Value Date/Time   NA 143 12/20/2016 0243   K 3.6 12/20/2016 0243   CL 113 (H) 12/20/2016 0243   CO2 22 12/20/2016 0243   GLUCOSE 303 (H) 12/20/2016 0243   BUN 26 (H) 12/20/2016 0243   CREATININE 1.19 12/20/2016 0243   CALCIUM 8.2 (L) 12/20/2016 0243   GFRNONAA >60 12/20/2016 0243   GFRAA >60 12/20/2016 0243   Culture: BCx 1/7 : strep pneumonia UCx 1/7 >> No growth Expectorated Cx 1/8 >> normal Respiratory Cx 1/8 >> reincubated Respiratory Cx 1/10 >> pending  Lines/Tubes: ETT 1/9 >> OGT 1/9 >> Foley 1/10 >>  Imaging: CXR 1/11 >Lines and tubes in stable position. Bilateral pulmonary infiltrates with partial clearing from prior Exam.  Antibiotics:  Azithromycin 1/7 >> Ceftriaxone 1/7  >> Vanc 1/8>1/8  Impression/Plan: [name] is a 56 yo male presenting with acute respiratory failure after recent influenza infection complicated by severe CAP, requiring intubation.  ASSESSMENT / PLAN:  PULMONARY A: Acute Respiratory Failure with Hypoxemia 2/2 Severe CAP and Possible ARDS versus Pulmonary Edema CXR 1/11 >Lines and tubes in stable position. Bilateral pulmonary infiltrates with partial clearing from prior exam. P:   Ceftriaxone and Azithromycin day 5 Off Vanc  ARDS Protocol Vent settings 500/60/24/10 Repeat respiratory culture pending Infiltrates improved after lasix 40 mg x 2 yesterday Repeat CXR tomorrow am  CARDIOVASCULAR A:  No acute issues P:  Monitor  RENAL A:   No acute issues P:   Trend BMET    GASTROINTESTINAL A:   No acute issues P:   Famotidine TF  HEMATOLOGIC A:   No acute issues P:  On Heparin Monitor CBC  INFECTIOUS  A:   Severe CAP:  Fever curve down, leukocytosis of 14 P:   On Ceftriaxone and Azithromycin Vanc stopped 1/9  ENDOCRINE A:   T2DM P:   CBGs  NEUROLOGIC A:   Sedated P:    On Fentanyl and Propofol  Neil Malay, DO PGY-3 Internal  Medicine Resident Pager # [phone] 12/20/2016 7:57 AM

## 2016-12-20 NOTE — Progress Notes (Signed)
Wake[place] assessment deferred patient on ARDS protocol respiratory status unstable.

## 2016-12-20 NOTE — Progress Notes (Signed)
  Echocardiogram 2D Echocardiogram has been performed.  Diamond Nickel 12/20/2016, 9:19 AM

## 2016-12-20 NOTE — Progress Notes (Signed)
Ice packs placed in axilla and groin areas as well as tylenol administered for fever

## 2016-12-20 NOTE — Progress Notes (Signed)
Inpatient Diabetes Program Recommendations  AACE/ADA: New Consensus Statement on Inpatient Glycemic Control (2015)  Target Ranges:  Prepandial:   less than 140 mg/dL      Peak postprandial:   less than 180 mg/dL (1-2 hours)      Critically ill patients:  140 - 180 mg/dL   Lab Results  Component Value Date   GLUCAP 247 (H) 12/20/2016    Review of Glycemic Control:  Results for AVRIAN, BALILES (MRN [id number]) as of 12/20/2016 10:01  Ref. Range 12/19/2016 15:57 12/19/2016 19:20 12/19/2016 23:18 12/20/2016 03:23 12/20/2016 08:51  Glucose-Capillary Latest Ref Range: 65 - 99 mg/dL 288 (H) 328 (H) 249 (H) 277 (H) 247 (H)   Inpatient Diabetes Program Recommendations:    CBG's greater than goal. Lantus increased to 30 units daily today. Note that CBG's increased with start of tube feeds.  Consider adding Novolog tube feed coverage 3 units q 4 hours.  Thanks, Adah Perl, RN, BC-ADM Inpatient Diabetes Coordinator Pager [phone] (8a-5p)

## 2016-12-21 ENCOUNTER — Inpatient Hospital Stay (HOSPITAL_COMMUNITY): Payer: 59

## 2016-12-21 LAB — BLOOD GAS, ARTERIAL
ACID-BASE EXCESS: 0 mmol/L (ref 0.0–2.0)
BICARBONATE: 23.6 mmol/L (ref 20.0–28.0)
Drawn by: 418751
FIO2: 50
LHR: 24 {breaths}/min
MECHVT: 550 mL
O2 Saturation: 92.2 %
PEEP/CPAP: 5 cmH2O
PO2 ART: 63.6 mmHg — AB (ref 83.0–108.0)
Patient temperature: 98.6
pCO2 arterial: 34.9 mmHg (ref 32.0–48.0)
pH, Arterial: 7.445 (ref 7.350–7.450)

## 2016-12-21 LAB — URINALYSIS, ROUTINE W REFLEX MICROSCOPIC
Bilirubin Urine: NEGATIVE
Glucose, UA: 50 mg/dL — AB
Ketones, ur: NEGATIVE mg/dL
LEUKOCYTES UA: NEGATIVE
Nitrite: NEGATIVE
PH: 5 (ref 5.0–8.0)
Protein, ur: 100 mg/dL — AB
SPECIFIC GRAVITY, URINE: 1.025 (ref 1.005–1.030)

## 2016-12-21 LAB — GLUCOSE, CAPILLARY
GLUCOSE-CAPILLARY: 166 mg/dL — AB (ref 65–99)
GLUCOSE-CAPILLARY: 238 mg/dL — AB (ref 65–99)
GLUCOSE-CAPILLARY: 246 mg/dL — AB (ref 65–99)
GLUCOSE-CAPILLARY: 252 mg/dL — AB (ref 65–99)
GLUCOSE-CAPILLARY: 279 mg/dL — AB (ref 65–99)
Glucose-Capillary: 240 mg/dL — ABNORMAL HIGH (ref 65–99)
Glucose-Capillary: 172 mg/dL — ABNORMAL HIGH (ref 65–99)

## 2016-12-21 LAB — BASIC METABOLIC PANEL
ANION GAP: 9 (ref 5–15)
BUN: 30 mg/dL — ABNORMAL HIGH (ref 6–20)
CHLORIDE: 114 mmol/L — AB (ref 101–111)
CO2: 23 mmol/L (ref 22–32)
Calcium: 8.3 mg/dL — ABNORMAL LOW (ref 8.9–10.3)
Creatinine, Ser: 1.35 mg/dL — ABNORMAL HIGH (ref 0.61–1.24)
GFR, EST NON AFRICAN AMERICAN: 58 mL/min — AB (ref >60)
Glucose, Bld: 241 mg/dL — ABNORMAL HIGH (ref 65–99)
POTASSIUM: 3.7 mmol/L (ref 3.5–5.1)
SODIUM: 146 mmol/L — AB (ref 135–145)

## 2016-12-21 LAB — TRIGLYCERIDES: Triglycerides: 160 mg/dL — ABNORMAL HIGH (ref <150)

## 2016-12-21 LAB — CBC
HEMATOCRIT: 28.5 % — AB (ref 39.0–52.0)
HEMOGLOBIN: 9.6 g/dL — AB (ref 13.0–17.0)
MCH: 27.2 pg (ref 26.0–34.0)
MCHC: 33.7 g/dL (ref 30.0–36.0)
MCV: 80.7 fL (ref 78.0–100.0)
Platelets: 169 10*3/uL (ref 150–400)
RBC: 3.53 MIL/uL — ABNORMAL LOW (ref 4.22–5.81)
RDW: 15.5 % (ref 11.5–15.5)
WBC: 15.8 10*3/uL — AB (ref 4.0–10.5)

## 2016-12-21 LAB — PHOSPHORUS: PHOSPHORUS: 2.5 mg/dL (ref 2.5–4.6)

## 2016-12-21 LAB — MAGNESIUM: Magnesium: 1.8 mg/dL (ref 1.7–2.4)

## 2016-12-21 MED ORDER — BISACODYL 10 MG RE SUPP
10.0000 mg | Freq: Once | RECTAL | Status: AC
  Administered 2016-12-21: 10 mg via RECTAL
  Filled 2016-12-21: qty 1

## 2016-12-21 MED ORDER — INSULIN GLARGINE 100 UNIT/ML ~~LOC~~ SOLN
33.0000 [IU] | Freq: Every day | SUBCUTANEOUS | Status: DC
Start: 2016-12-22 — End: 2016-12-25
  Administered 2016-12-22 – 2016-12-24 (×3): 33 [IU] via SUBCUTANEOUS
  Filled 2016-12-21 (×4): qty 0.33

## 2016-12-21 MED ORDER — SENNOSIDES 8.8 MG/5ML PO SYRP
5.0000 mL | ORAL_SOLUTION | Freq: Every day | ORAL | Status: DC
  Administered 2016-12-22: 5 mL via ORAL
  Filled 2016-12-21 (×2): qty 5

## 2016-12-21 MED ORDER — STERILE WATER FOR INJECTION IJ SOLN
INTRAMUSCULAR | Status: AC
  Administered 2016-12-21: 10 mL
  Filled 2016-12-21: qty 10

## 2016-12-21 MED ORDER — POLYETHYLENE GLYCOL 3350 17 G PO PACK
17.0000 g | PACK | Freq: Every day | ORAL | Status: DC
  Administered 2016-12-21: 17 g via ORAL
  Filled 2016-12-21 (×3): qty 1

## 2016-12-21 MED ORDER — SODIUM CHLORIDE 0.9 % IV SOLN: INTRAVENOUS | Status: DC

## 2016-12-21 MED ORDER — INSULIN ASPART 100 UNIT/ML ~~LOC~~ SOLN
0.0000 [IU] | SUBCUTANEOUS | Status: DC
  Administered 2016-12-21 (×2): 11 [IU] via SUBCUTANEOUS
  Administered 2016-12-21: 7 [IU] via SUBCUTANEOUS
  Administered 2016-12-21 – 2016-12-22 (×2): 4 [IU] via SUBCUTANEOUS
  Administered 2016-12-22: 3 [IU] via SUBCUTANEOUS
  Administered 2016-12-22: 4 [IU] via SUBCUTANEOUS
  Administered 2016-12-22: 3 [IU] via SUBCUTANEOUS
  Administered 2016-12-22 – 2016-12-23 (×4): 4 [IU] via SUBCUTANEOUS
  Administered 2016-12-23 (×2): 3 [IU] via SUBCUTANEOUS
  Administered 2016-12-24 (×3): 4 [IU] via SUBCUTANEOUS
  Administered 2016-12-24: 3 [IU] via SUBCUTANEOUS
  Administered 2016-12-24 (×2): 4 [IU] via SUBCUTANEOUS
  Administered 2016-12-24: 7 [IU] via SUBCUTANEOUS
  Administered 2016-12-25 (×3): 4 [IU] via SUBCUTANEOUS
  Administered 2016-12-25 – 2016-12-26 (×3): 3 [IU] via SUBCUTANEOUS
  Administered 2016-12-26 (×2): 4 [IU] via SUBCUTANEOUS
  Administered 2016-12-26: 3 [IU] via SUBCUTANEOUS
  Administered 2016-12-26: 4 [IU] via SUBCUTANEOUS
  Administered 2016-12-26: 3 [IU] via SUBCUTANEOUS
  Administered 2016-12-27: 4 [IU] via SUBCUTANEOUS
  Administered 2016-12-27 (×2): 3 [IU] via SUBCUTANEOUS

## 2016-12-21 MED ORDER — INSULIN ASPART 100 UNIT/ML ~~LOC~~ SOLN
7.0000 [IU] | SUBCUTANEOUS | Status: DC
  Administered 2016-12-21 – 2016-12-26 (×23): 7 [IU] via SUBCUTANEOUS

## 2016-12-21 MED ORDER — VECURONIUM BROMIDE 10 MG IV SOLR
10.0000 mg | Freq: Once | INTRAVENOUS | Status: AC
  Administered 2016-12-21: 10 mg via INTRAVENOUS
  Filled 2016-12-21: qty 10

## 2016-12-21 MED ORDER — FUROSEMIDE 10 MG/ML IJ SOLN
40.0000 mg | Freq: Once | INTRAMUSCULAR | Status: AC
  Administered 2016-12-21: 40 mg via INTRAVENOUS

## 2016-12-21 NOTE — Procedures (Signed)
PCCM Video Bronchoscopy Procedure Note  The patient was informed of the risks (including but not limited to bleeding, infection, respiratory failure, lung injury, tooth/oral injury) and benefits of the procedure and gave consent, see chart.  Indication: ARDS, HCAP  Post Procedure Diagnosis: Same  Location: [facility] MICU  Condition pre procedure: critically ill on vent  Medications for procedure: propofol gtt, fentanyl gtt  Procedure description: The bronchoscope was introduced through the endotracheal tube and passed to the bilateral lungs to the level of the subsegmental bronchi throughout the tracheobronchial tree.  Airway exam revealed normal airway mucosa, some thin secretions bilaterally.    Procedures performed: BAL [place], 30 cc sterile saline instilled, 15cc cloudy return.  Specimens sent: respiratory culture  Condition post procedure: critically ill, on vent  EBL: none  Complications: none  Roselie Awkward, MD [place] PCCM Pager: [phone] Cell: [phone] After 3pm or if no response, call [phone]

## 2016-12-21 NOTE — Progress Notes (Signed)
eLink Physician-Brief Progress Note Patient Name: Neil Murphy DOB: 27-Dec-1960 MRN: [id number]   Date of Service  12/21/2016  HPI/Events of Note  ARDS - severe vent asynchrony, double clutching , alarms RASS-5 on propofol/ fent gtt  eICU Interventions  Changed to PCV - persists Vecuronium 10 mg x 1 FU ABG in 1h      Intervention Category Major Interventions: Respiratory failure - evaluation and management  ALVA,RAKESH V. 12/21/2016, 2:59 AM

## 2016-12-21 NOTE — Progress Notes (Signed)
Inpatient Diabetes Program Recommendations  AACE/ADA: New Consensus Statement on Inpatient Glycemic Control (2015)  Target Ranges:  Prepandial:   less than 140 mg/dL      Peak postprandial:   less than 180 mg/dL (1-2 hours)      Critically ill patients:  140 - 180 mg/dL   Results for Neil Murphy, Neil Murphy (MRN [id number]) as of 12/21/2016 08:22  Ref. Range 12/19/2016 23:18 12/20/2016 03:23 12/20/2016 08:51 12/20/2016 12:11 12/20/2016 16:15 12/20/2016 20:22  Glucose-Capillary Latest Ref Range: 65 - 99 mg/dL 249 (H) 277 (H) 247 (H) 269 (H) 290 (H) 218 (H)   Results for Neil Murphy, Neil Murphy (MRN [id number]) as of 12/21/2016 08:22  Ref. Range 12/20/2016 23:51 12/21/2016 03:09  Glucose-Capillary Latest Ref Range: 65 - 99 mg/dL 252 (H) 238 (H)    Home DM Meds: Metformin 1000 mg BID  Current Insulin Orders: Lantus 30 units daily      Novolog Resistant Correction Scale/ SSI (0-20 units) Q4 hours      Novolog 3 units Q4 hours      MD- Note CBGs consistently >200 mg/dl despite increase of Lantus to 30 units daily and addition of Novolog tube feed coverage yesterday.  Please consider initiation of the ICU Glycemic Control Protocol for this patient- Phase 2 IV Insulin     --Will follow patient during hospitalization--  Wyn Quaker RN, MSN, CDE Diabetes Coordinator Inpatient Glycemic Control Team Team Pager: [phone] (8a-5p)

## 2016-12-21 NOTE — Progress Notes (Signed)
In response to CDI query:  Acute Respiratory Failure with Hypoxemia 2/2 Severe CAP and Possible ARDS versus Acute Pulmonary Edema CXR showed slight progression of bilateral infiltrates P: Ceftriaxone and Azithromycin  ARDS Protocol Vent settings PCV 550/50/24/10 P/F ratio stable from yesterday at 127 Lasix 40 mg IV once today Plan for Bronch today  [name], DO PGY-3 Internal Medicine Resident Pager # [phone] 12/21/2016 11:56 AM

## 2016-12-21 NOTE — Progress Notes (Signed)
[facility] PCCM  Subjective: Overnight, patient had severe vent asynchrony with double clutching despite RASS -5 on propofol and fentanyl gtt. Patient was changed to PCV with vecuronium 10 mg x 1. Resulting ABG showed pH of 7.445, pCO2 34.9, pO2 63.6 with FIO2 of 50.   Past Medical History:  Diagnosis Date  . GERD (gastroesophageal reflux disease)    OCCASIONALLY  . Phimosis   . Type 2 diabetes mellitus ([place])     O: Vitals:   12/21/16 0326 12/21/16 0400 12/21/16 0500 12/21/16 0600  BP:  (!) 104/52 (!) 106/56 (!) 106/57  Pulse:  65 65 61  Resp:  (!) 24 (!) 24 (!) 24  Temp:      TempSrc:      SpO2:  94% 93% 91%  Weight: 214 lb 8.1 oz (97.3 kg)     Height:       Gen: Intubated, sedated HENT: Intubated,  neck supple PULM: Crackles in bilateral bases R > L, irregular breathing on ventilator ?stacking CV: RRR, no MRG  GI: normoactive BS, distended  Ext: No LEE b/l  Neuro: Intubated and sedated  CBC    Component Value Date/Time   WBC 15.8 (H) 12/21/2016 0311   RBC 3.53 (L) 12/21/2016 0311   HGB 9.6 (L) 12/21/2016 0311   HCT 28.5 (L) 12/21/2016 0311   PLT 169 12/21/2016 0311   MCV 80.7 12/21/2016 0311   MCH 27.2 12/21/2016 0311   MCHC 33.7 12/21/2016 0311   RDW 15.5 12/21/2016 0311   LYMPHSABS 1.0 12/17/2016 0458   MONOABS 0.5 12/17/2016 0458   EOSABS 0.0 12/17/2016 0458   BASOSABS 0.1 12/17/2016 0458   BMET    Component Value Date/Time   NA 146 (H) 12/21/2016 0311   K 3.7 12/21/2016 0311   CL 114 (H) 12/21/2016 0311   CO2 23 12/21/2016 0311   GLUCOSE 241 (H) 12/21/2016 0311   BUN 30 (H) 12/21/2016 0311   CREATININE 1.35 (H) 12/21/2016 0311   CALCIUM 8.3 (L) 12/21/2016 0311   GFRNONAA 58 (L) 12/21/2016 0311   GFRAA >60 12/21/2016 0311   Culture: BCx 1/7 : strep pneumonia UCx 1/7 >> No growth Expectorated Cx 1/8 >> normal Respiratory Cx 1/8 >> normal Respiratory Cx 1/10 >> pending  Lines/Tubes: ETT 1/9 >> OGT 1/9 >> Foley 1/10 >>  Imaging: CXR 1/11 >Lines  and tubes in stable position. Bilateral pulmonary infiltrates with partial clearing from prior Exam.  Antibiotics:  Azithromycin 1/7 >> Ceftriaxone 1/7 >> Vanc 1/8>1/8  Impression/Plan: Neil Murphy is a 56 yo male presenting with acute respiratory failure after recent influenza infection complicated by severe CAP, requiring intubation.  ASSESSMENT / PLAN:  PULMONARY A: Acute Respiratory Failure with Hypoxemia 2/2 Severe CAP and Possible ARDS versus Pulmonary Edema CXR 1/11 >Lines and tubes in stable position. Bilateral pulmonary infiltrates with partial clearing from prior exam. P:   Ceftriaxone and Azithromycin  ARDS Protocol Vent settings PCV 550/50/24/10 P/F ratio stable from yesterday at 127 Repeat respiratory culture pending Repeat CXR today  CARDIOVASCULAR A:  Pulmonary Edema P:  Diuresing Repeat CXR Normal echocardiogram  RENAL A:   Mild AKI- likely secondary to diuresis, repeat CXR, c/s decreasing lasix P:   Trend BMET  Consider decreasing Lasix   GASTROINTESTINAL A:   No acute issues P:   Famotidine TF No BM  HEMATOLOGIC A:   Microcytic Anemia- new, Hgb decreasing from 12 > 9 P:  On Heparin Monitor CBC Check FOBT  INFECTIOUS  A:   Severe  CAP:  Continues to be febrile, WBC 14 > 15 P:   On Ceftriaxone and Azithromycin Vanc stopped 1/9  ENDOCRINE A:   T2DM P:   CBGs Lantus SSI Novolog  NEUROLOGIC A:   Sedated P:    On Fentanyl and Propofol  [name], DO PGY-3 Internal Medicine Resident Pager # [phone] 12/21/2016 7:28 AM

## 2016-12-22 LAB — GLUCOSE, CAPILLARY
GLUCOSE-CAPILLARY: 183 mg/dL — AB (ref 65–99)
Glucose-Capillary: 143 mg/dL — ABNORMAL HIGH (ref 65–99)
Glucose-Capillary: 149 mg/dL — ABNORMAL HIGH (ref 65–99)
Glucose-Capillary: 151 mg/dL — ABNORMAL HIGH (ref 65–99)
Glucose-Capillary: 163 mg/dL — ABNORMAL HIGH (ref 65–99)
Glucose-Capillary: 170 mg/dL — ABNORMAL HIGH (ref 65–99)

## 2016-12-22 LAB — CULTURE, RESPIRATORY W GRAM STAIN

## 2016-12-22 LAB — POCT I-STAT 3, ART BLOOD GAS (G3+)
Acid-base deficit: 1 mmol/L (ref 0.0–2.0)
Bicarbonate: 23.4 mmol/L (ref 20.0–28.0)
O2 Saturation: 97 %
PCO2 ART: 36.6 mmHg (ref 32.0–48.0)
PH ART: 7.419 (ref 7.350–7.450)
Patient temperature: 100.5
TCO2: 24 mmol/L (ref 0–100)
pO2, Arterial: 88 mmHg (ref 83.0–108.0)

## 2016-12-22 LAB — CBC
HCT: 27.1 % — ABNORMAL LOW (ref 39.0–52.0)
HEMOGLOBIN: 9.2 g/dL — AB (ref 13.0–17.0)
MCH: 27.9 pg (ref 26.0–34.0)
MCHC: 33.9 g/dL (ref 30.0–36.0)
MCV: 82.1 fL (ref 78.0–100.0)
Platelets: 214 10*3/uL (ref 150–400)
RBC: 3.3 MIL/uL — AB (ref 4.22–5.81)
RDW: 15.9 % — ABNORMAL HIGH (ref 11.5–15.5)
WBC: 14.3 10*3/uL — ABNORMAL HIGH (ref 4.0–10.5)

## 2016-12-22 LAB — BASIC METABOLIC PANEL
Anion gap: 7 (ref 5–15)
BUN: 39 mg/dL — ABNORMAL HIGH (ref 6–20)
CHLORIDE: 116 mmol/L — AB (ref 101–111)
CO2: 26 mmol/L (ref 22–32)
CREATININE: 1.28 mg/dL — AB (ref 0.61–1.24)
Calcium: 8.4 mg/dL — ABNORMAL LOW (ref 8.9–10.3)
Glucose, Bld: 182 mg/dL — ABNORMAL HIGH (ref 65–99)
POTASSIUM: 3.4 mmol/L — AB (ref 3.5–5.1)
SODIUM: 149 mmol/L — AB (ref 135–145)

## 2016-12-22 LAB — MAGNESIUM: MAGNESIUM: 2.1 mg/dL (ref 1.7–2.4)

## 2016-12-22 LAB — PHOSPHORUS: PHOSPHORUS: 3 mg/dL (ref 2.5–4.6)

## 2016-12-22 LAB — CULTURE, RESPIRATORY

## 2016-12-22 LAB — URINE CULTURE: CULTURE: NO GROWTH

## 2016-12-22 MED ORDER — MIDAZOLAM HCL 2 MG/2ML IJ SOLN
2.0000 mg | INTRAMUSCULAR | Status: DC | PRN
  Administered 2016-12-22: 2 mg via INTRAVENOUS

## 2016-12-22 MED ORDER — MIDAZOLAM HCL 2 MG/2ML IJ SOLN: 2.0000 mg | INTRAMUSCULAR | Status: DC | PRN

## 2016-12-22 MED ORDER — SODIUM CHLORIDE 0.9 % IV SOLN
1.0000 mg/h | INTRAVENOUS | Status: DC
  Administered 2016-12-22: 6 mg/h via INTRAVENOUS
  Administered 2016-12-22: 2 mg/h via INTRAVENOUS
  Administered 2016-12-23: 6 mg/h via INTRAVENOUS
  Filled 2016-12-22 (×5): qty 10

## 2016-12-22 MED ORDER — MIDAZOLAM HCL 2 MG/2ML IJ SOLN
INTRAMUSCULAR | Status: AC
Start: 2016-12-22 — End: 2016-12-22
  Administered 2016-12-22: 2 mg via INTRAVENOUS
  Filled 2016-12-22: qty 2

## 2016-12-22 NOTE — Progress Notes (Addendum)
Notified RN of 103.1 temp

## 2016-12-22 NOTE — Progress Notes (Signed)
Called by RN that patient is having vent asynchrony. Currently on ARDS protocol. Propofol and fent at max. Added Versed prn.

## 2016-12-22 NOTE — Progress Notes (Signed)
[facility] PCCM  Subjective:  Some vent dyssynchrony overnight Required versed prn  Oxygenation about the same Slightly net positive Had a bowel movement   Past Medical History:  Diagnosis Date  . GERD (gastroesophageal reflux disease)    OCCASIONALLY  . Phimosis   . Type 2 diabetes mellitus ([place])     O: Vitals:   12/22/16 1000 12/22/16 1100 12/22/16 1130 12/22/16 1200  BP: (!) 124/57 (!) 127/59  137/70  Pulse: 62 60 61 (!) 58  Resp: (!) 24 19 (!) 21 19  Temp:      TempSrc:      SpO2: 98% 95% 95% 98%  Weight:      Height:       Gen: sedated heavily HENT: NCAT, ETT in place PULM: Few crackles, vent supported breathing CV: RRR, no MRG  GI:  BS+, soft, nontender Ext: No edema legs Neuro:sedated heavily  CBC    Component Value Date/Time   WBC 14.3 (H) 12/22/2016 0402   RBC 3.30 (L) 12/22/2016 0402   HGB 9.2 (L) 12/22/2016 0402   HCT 27.1 (L) 12/22/2016 0402   PLT 214 12/22/2016 0402   MCV 82.1 12/22/2016 0402   MCH 27.9 12/22/2016 0402   MCHC 33.9 12/22/2016 0402   RDW 15.9 (H) 12/22/2016 0402   LYMPHSABS 1.0 12/17/2016 0458   MONOABS 0.5 12/17/2016 0458   EOSABS 0.0 12/17/2016 0458   BASOSABS 0.1 12/17/2016 0458   BMET    Component Value Date/Time   NA 149 (H) 12/22/2016 0402   K 3.4 (L) 12/22/2016 0402   CL 116 (H) 12/22/2016 0402   CO2 26 12/22/2016 0402   GLUCOSE 182 (H) 12/22/2016 0402   BUN 39 (H) 12/22/2016 0402   CREATININE 1.28 (H) 12/22/2016 0402   CALCIUM 8.4 (L) 12/22/2016 0402   GFRNONAA >60 12/22/2016 0402   GFRAA >60 12/22/2016 0402   Culture: BCx 1/7 : strep pneumonia UCx 1/7 >> No growth Expectorated Cx 1/8 >> normal Respiratory Cx 1/8 >> normal Respiratory Cx 1/10 >> pending  Urine 1/12 >  Blood 1/12 >  BAL 1/12 >   Lines/Tubes: ETT 1/9 >> OGT 1/9 >> Foley 1/10 >>  Imaging: CXR 1/11 >Lines and tubes in stable position. Bilateral pulmonary infiltrates with partial clearing from prior  Exam.  Antibiotics:  Azithromycin 1/7  >> Ceftriaxone 1/7 >> Vanc 1/8>1/8  Impression/Plan: Neil Murphy is a 56 yo male presenting with acute respiratory failure and ARDS due to influenza infection complicated by severe CAP, requiring intubation.  ASSESSMENT / PLAN:  PULMONARY A: Acute Respiratory Failure with Hypoxemia 2/2 Severe CAP  ARDS > progressing as normally expected P:   ABG now ARDS Protocol Vent settings PCV 550/50/24/10 Repeat CXR 1/14 VAP prevention measures Sedate for vent synchrony Thorazine prn hiccoughs  CARDIOVASCULAR A:  Pulmonary Edema > resolved P:  Diuresing Tele monitoring  RENAL A:   Rising Cr in setting of lasix P:   Monitor BMET and UOP Replace electrolytes as needed Hold lasix today   GASTROINTESTINAL A:   Constipation> improving P:   Famotidine TF  HEMATOLOGIC A:   Microcytic Anemia- new, Hgb decreasing from 12 > 9 P:  Monitor for bleeding DVT prophylaxis with sub q heparin  INFECTIOUS  A:   Severe CAP:  Continues to be febrile, WBC 14 > 15 P:   Continue Ceftriaxone and Azithromycin Monitor repeat cultures 1/12  ENDOCRINE A:   T2DM P:   CBGs Lantus SSI Novolog  NEUROLOGIC A:   Sedated  P:    RASS goal -3 Change to versed gtt given dyssynchrony Continue fentanyl gtt Stop propofol  My cc time 35 minutes  Roselie Awkward, MD [place] PCCM Pager: [phone] Cell: [phone] After 3pm or if no response, call [phone]  12/22/2016 12:41 PM

## 2016-12-23 ENCOUNTER — Inpatient Hospital Stay (HOSPITAL_COMMUNITY): Payer: 59

## 2016-12-23 LAB — CULTURE, RESPIRATORY

## 2016-12-23 LAB — BASIC METABOLIC PANEL
Anion gap: 6 (ref 5–15)
Anion gap: 5 (ref 5–15)
BUN: 34 mg/dL — AB (ref 6–20)
BUN: 34 mg/dL — ABNORMAL HIGH (ref 6–20)
CALCIUM: 8.3 mg/dL — AB (ref 8.9–10.3)
CALCIUM: 8.3 mg/dL — AB (ref 8.9–10.3)
CHLORIDE: 118 mmol/L — AB (ref 101–111)
CO2: 26 mmol/L (ref 22–32)
CO2: 24 mmol/L (ref 22–32)
Chloride: 121 mmol/L — ABNORMAL HIGH (ref 101–111)
Creatinine, Ser: 1.02 mg/dL (ref 0.61–1.24)
Creatinine, Ser: 0.92 mg/dL (ref 0.61–1.24)
GLUCOSE: 174 mg/dL — AB (ref 65–99)
Glucose, Bld: 162 mg/dL — ABNORMAL HIGH (ref 65–99)
POTASSIUM: 3.7 mmol/L (ref 3.5–5.1)
Potassium: 3.6 mmol/L (ref 3.5–5.1)
Sodium: 150 mmol/L — ABNORMAL HIGH (ref 135–145)
Sodium: 150 mmol/L — ABNORMAL HIGH (ref 135–145)

## 2016-12-23 LAB — GLUCOSE, CAPILLARY
GLUCOSE-CAPILLARY: 147 mg/dL — AB (ref 65–99)
GLUCOSE-CAPILLARY: 148 mg/dL — AB (ref 65–99)
GLUCOSE-CAPILLARY: 155 mg/dL — AB (ref 65–99)
Glucose-Capillary: 160 mg/dL — ABNORMAL HIGH (ref 65–99)
Glucose-Capillary: 173 mg/dL — ABNORMAL HIGH (ref 65–99)
Glucose-Capillary: 99 mg/dL (ref 65–99)

## 2016-12-23 LAB — CBC WITH DIFFERENTIAL/PLATELET
BASOS ABS: 0.1 10*3/uL (ref 0.0–0.1)
Basophils Relative: 1 %
EOS ABS: 0.1 10*3/uL (ref 0.0–0.7)
Eosinophils Relative: 1 %
HCT: 26.4 % — ABNORMAL LOW (ref 39.0–52.0)
Hemoglobin: 8.7 g/dL — ABNORMAL LOW (ref 13.0–17.0)
LYMPHS ABS: 1.5 10*3/uL (ref 0.7–4.0)
Lymphocytes Relative: 12 %
MCH: 27.5 pg (ref 26.0–34.0)
MCHC: 33 g/dL (ref 30.0–36.0)
MCV: 83.5 fL (ref 78.0–100.0)
MONO ABS: 0.5 10*3/uL (ref 0.1–1.0)
Monocytes Relative: 4 %
NEUTROS ABS: 10.2 10*3/uL — AB (ref 1.7–7.7)
Neutrophils Relative %: 82 %
PLATELETS: 259 10*3/uL (ref 150–400)
RBC: 3.16 MIL/uL — ABNORMAL LOW (ref 4.22–5.81)
RDW: 15.4 % (ref 11.5–15.5)
WBC: 12.4 10*3/uL — AB (ref 4.0–10.5)

## 2016-12-23 LAB — OCCULT BLOOD X 1 CARD TO LAB, STOOL: Fecal Occult Bld: NEGATIVE

## 2016-12-23 LAB — MAGNESIUM: MAGNESIUM: 2.1 mg/dL (ref 1.7–2.4)

## 2016-12-23 LAB — CULTURE, RESPIRATORY W GRAM STAIN: Culture: NORMAL

## 2016-12-23 LAB — PHOSPHORUS: PHOSPHORUS: 2.9 mg/dL (ref 2.5–4.6)

## 2016-12-23 MED ORDER — FREE WATER
200.0000 mL | Freq: Four times a day (QID) | Status: AC
  Administered 2016-12-23 – 2016-12-24 (×4): 200 mL

## 2016-12-23 MED ORDER — SENNOSIDES 8.8 MG/5ML PO SYRP
5.0000 mL | ORAL_SOLUTION | Freq: Every evening | ORAL | Status: DC | PRN
  Filled 2016-12-23: qty 5

## 2016-12-23 MED ORDER — POLYETHYLENE GLYCOL 3350 17 G PO PACK
17.0000 g | PACK | Freq: Every day | ORAL | Status: DC | PRN
  Filled 2016-12-23: qty 1

## 2016-12-23 NOTE — Progress Notes (Signed)
Pt temp 101.3  RN notified

## 2016-12-23 NOTE — Progress Notes (Signed)
Pt temp 101.9 Notified RN

## 2016-12-23 NOTE — Progress Notes (Signed)
[facility] PCCM  Subjective:  Fever 103.1 overnight. Nurse reports some agitation overnight, had to increase fentanyl.    Past Medical History:  Diagnosis Date  . GERD (gastroesophageal reflux disease)    OCCASIONALLY  . Phimosis   . Type 2 diabetes mellitus ([place])     O: Vitals:   12/23/16 0336 12/23/16 0400 12/23/16 0500 12/23/16 0600  BP: 118/79 116/64 114/60 (!) 114/58  Pulse: 61 (!) 57 (!) 55 (!) 55  Resp: (!) 23 (!) 24 (!) 24 (!) 24  Temp:      TempSrc:      SpO2: 98% 95% 95% 97%  Weight:  211 lb 6.7 oz (95.9 kg)    Height:       Gen: sedated heavily HENT: [place]/AT, ETT in place   PULM: Few crackles at bases, vent supported breathing CV: RRR, no m/g/r GI:  BS+, soft, nontender Ext: No peripheral edema  Neuro: sedated heavily  CBC    Component Value Date/Time   WBC 12.4 (H) 12/23/2016 0213   RBC 3.16 (L) 12/23/2016 0213   HGB 8.7 (L) 12/23/2016 0213   HCT 26.4 (L) 12/23/2016 0213   PLT 259 12/23/2016 0213   MCV 83.5 12/23/2016 0213   MCH 27.5 12/23/2016 0213   MCHC 33.0 12/23/2016 0213   RDW 15.4 12/23/2016 0213   LYMPHSABS 1.5 12/23/2016 0213   MONOABS 0.5 12/23/2016 0213   EOSABS 0.1 12/23/2016 0213   BASOSABS 0.1 12/23/2016 0213   BMET    Component Value Date/Time   NA 150 (H) 12/23/2016 0213   K 3.6 12/23/2016 0213   CL 118 (H) 12/23/2016 0213   CO2 26 12/23/2016 0213   GLUCOSE 162 (H) 12/23/2016 0213   BUN 34 (H) 12/23/2016 0213   CREATININE 1.02 12/23/2016 0213   CALCIUM 8.3 (L) 12/23/2016 0213   GFRNONAA >60 12/23/2016 0213   GFRAA >60 12/23/2016 0213   Culture: BCx 1/7 : strep pneumonia UCx 1/7 >> No growth Expectorated Cx 1/8 >> normal Respiratory Cx 1/8 >> normal Respiratory Cx 1/10 >> rare candida albicans  Urine 1/12 > no growth Blood 1/12 >  BAL 1/12 >   Lines/Tubes: ETT 1/9 >> OGT 1/9 >> Foley 1/10 >>  Imaging: CXR 1/11 >Lines and tubes in stable position. Bilateral pulmonary infiltrates with partial clearing from prior  exam  Antibiotics:  Azithromycin 1/7 >>1/13 Ceftriaxone 1/7 >>1/13 Vanc 1/8>1/8  Impression/Plan: Mr. Wendorf is a 56 yo male presenting with acute respiratory failure and ARDS due to influenza infection complicated by severe CAP, requiring intubation.  ASSESSMENT / PLAN:  PULMONARY A: Acute Respiratory Failure with Hypoxemia 2/2 Severe CAP  ARDS > progressing as normally expected P:   ARDS Protocol Vent settings PCV 550/50/24/10 Repeat CXR in AM VAP prevention measures Sedate for vent synchrony Thorazine prn hiccups  CARDIOVASCULAR A:  Pulmonary Edema > resolved P:  Tele monitoring  RENAL A:   Rising Cr in setting of lasix, improved after lasix stopped Hypernatremia P:   Monitor BMET and UOP Replace electrolytes as needed Hold lasix   GASTROINTESTINAL A:   Constipation> improving P:   Famotidine TF Change laxatives to PRN  HEMATOLOGIC A:   Microcytic Anemia- new, Hgb decreasing from 12 > 9 P:  Monitor for bleeding DVT prophylaxis with sub q heparin  INFECTIOUS  A:   Severe CAP:  Continues to be febrile, WBC 14 > 15 > 12 P:   Completed Ceftriaxone and Azithromycin course Monitor repeat cultures 1/12  ENDOCRINE A:  T2DM P:   CBGs Lantus SSI Novolog  NEUROLOGIC A:   Sedated P:    RASS goal -3 Continue versed gtt given dyssynchrony Continue fentanyl gtt  Albin Felling, MD, MPH Internal Medicine Resident, PGY-III Pager: [phone] 12/23/2016 6:41 AM

## 2016-12-23 NOTE — Progress Notes (Signed)
pts temp 101.2 RN notified

## 2016-12-24 DIAGNOSIS — J189 Pneumonia, unspecified organism: Secondary | ICD-10-CM

## 2016-12-24 LAB — BASIC METABOLIC PANEL
ANION GAP: 8 (ref 5–15)
Anion gap: 6 (ref 5–15)
Anion gap: 6 (ref 5–15)
BUN: 35 mg/dL — ABNORMAL HIGH (ref 6–20)
BUN: 35 mg/dL — ABNORMAL HIGH (ref 6–20)
BUN: 36 mg/dL — ABNORMAL HIGH (ref 6–20)
CALCIUM: 8.4 mg/dL — AB (ref 8.9–10.3)
CALCIUM: 8.4 mg/dL — AB (ref 8.9–10.3)
CHLORIDE: 119 mmol/L — AB (ref 101–111)
CHLORIDE: 118 mmol/L — AB (ref 101–111)
CO2: 25 mmol/L (ref 22–32)
CO2: 23 mmol/L (ref 22–32)
CO2: 23 mmol/L (ref 22–32)
CREATININE: 0.92 mg/dL (ref 0.61–1.24)
CREATININE: 0.84 mg/dL (ref 0.61–1.24)
Calcium: 8.5 mg/dL — ABNORMAL LOW (ref 8.9–10.3)
Chloride: 119 mmol/L — ABNORMAL HIGH (ref 101–111)
Creatinine, Ser: 0.82 mg/dL (ref 0.61–1.24)
GFR calc Af Amer: >60 mL/min (ref >60)
GFR calc non Af Amer: >60 mL/min (ref >60)
GFR calc non Af Amer: >60 mL/min (ref >60)
Glucose, Bld: 157 mg/dL — ABNORMAL HIGH (ref 65–99)
Glucose, Bld: 202 mg/dL — ABNORMAL HIGH (ref 65–99)
Glucose, Bld: 195 mg/dL — ABNORMAL HIGH (ref 65–99)
POTASSIUM: 3.6 mmol/L (ref 3.5–5.1)
Potassium: 3.5 mmol/L (ref 3.5–5.1)
Potassium: 3.5 mmol/L (ref 3.5–5.1)
SODIUM: 150 mmol/L — AB (ref 135–145)
SODIUM: 150 mmol/L — AB (ref 135–145)
Sodium: 147 mmol/L — ABNORMAL HIGH (ref 135–145)

## 2016-12-24 LAB — GLUCOSE, CAPILLARY
GLUCOSE-CAPILLARY: 143 mg/dL — AB (ref 65–99)
GLUCOSE-CAPILLARY: 201 mg/dL — AB (ref 65–99)
GLUCOSE-CAPILLARY: 178 mg/dL — AB (ref 65–99)
Glucose-Capillary: 162 mg/dL — ABNORMAL HIGH (ref 65–99)
Glucose-Capillary: 179 mg/dL — ABNORMAL HIGH (ref 65–99)
Glucose-Capillary: 177 mg/dL — ABNORMAL HIGH (ref 65–99)

## 2016-12-24 LAB — CBC
HCT: 27 % — ABNORMAL LOW (ref 39.0–52.0)
Hemoglobin: 8.6 g/dL — ABNORMAL LOW (ref 13.0–17.0)
MCH: 27 pg (ref 26.0–34.0)
MCHC: 31.9 g/dL (ref 30.0–36.0)
MCV: 84.6 fL (ref 78.0–100.0)
Platelets: 355 10*3/uL (ref 150–400)
RBC: 3.19 MIL/uL — ABNORMAL LOW (ref 4.22–5.81)
RDW: 15.7 % — AB (ref 11.5–15.5)
WBC: 10.6 10*3/uL — AB (ref 4.0–10.5)

## 2016-12-24 LAB — PHOSPHORUS: PHOSPHORUS: 2.7 mg/dL (ref 2.5–4.6)

## 2016-12-24 MED ORDER — DEXTROSE 5 % IV SOLN
INTRAVENOUS | Status: AC
  Administered 2016-12-24: 13:00:00 via INTRAVENOUS
  Administered 2016-12-25: 75 mL via INTRAVENOUS

## 2016-12-24 MED ORDER — CHLORPROMAZINE HCL 25 MG PO TABS
25.0000 mg | ORAL_TABLET | Freq: Three times a day (TID) | ORAL | Status: DC | PRN
  Administered 2016-12-24 – 2016-12-25 (×2): 25 mg
  Filled 2016-12-24 (×3): qty 1

## 2016-12-24 MED ORDER — FAMOTIDINE 40 MG/5ML PO SUSR
20.0000 mg | Freq: Two times a day (BID) | ORAL | Status: DC
  Administered 2016-12-24 – 2016-12-26 (×4): 20 mg via ORAL
  Filled 2016-12-24 (×4): qty 2.5

## 2016-12-24 MED ORDER — POTASSIUM CHLORIDE 20 MEQ/15ML (10%) PO SOLN
40.0000 meq | Freq: Once | ORAL | Status: AC
Start: 2016-12-24 — End: 2016-12-24
  Administered 2016-12-24: 40 meq
  Filled 2016-12-24: qty 30

## 2016-12-24 MED ORDER — FREE WATER
200.0000 mL | Freq: Four times a day (QID) | Status: AC
  Administered 2016-12-24 – 2016-12-25 (×4): 200 mL

## 2016-12-24 NOTE — Progress Notes (Signed)
Pt placed back on full support at this time due to increased RR >35, pt tolerating full support well, R Twill monitor

## 2016-12-24 NOTE — Progress Notes (Signed)
[facility] PCCM  Subjective:  Fever 101.9 overnight.  PEEP down to 5. Patient awake and following commands.    Past Medical History:  Diagnosis Date  . GERD (gastroesophageal reflux disease)    OCCASIONALLY  . Phimosis   . Type 2 diabetes mellitus ([place])     O: Vitals:   12/24/16 0300 12/24/16 0353 12/24/16 0400 12/24/16 0430  BP: (!) 158/83 (!) 160/84 (!) 152/79   Pulse: 78 84 90   Resp: (!) 24 (!) 23 (!) 21   Temp:    (!) 101 F (38.3 C)  TempSrc:    Oral  SpO2: 97% 93% 97%   Weight:   212 lb 15.4 oz (96.6 kg)   Height:       Gen: awake, on vent  HENT: [place]/AT, ETT in place   PULM: Few crackles at bases, vent supported breathing CV: RRR, no m/g/r GI:  BS+, soft, nontender Ext: No peripheral edema, moves all extremities  Neuro: alert, awake, following commands  CBC    Component Value Date/Time   WBC 10.6 (H) 12/24/2016 0220   RBC 3.19 (L) 12/24/2016 0220   HGB 8.6 (L) 12/24/2016 0220   HCT 27.0 (L) 12/24/2016 0220   PLT 355 12/24/2016 0220   MCV 84.6 12/24/2016 0220   MCH 27.0 12/24/2016 0220   MCHC 31.9 12/24/2016 0220   RDW 15.7 (H) 12/24/2016 0220   LYMPHSABS 1.5 12/23/2016 0213   MONOABS 0.5 12/23/2016 0213   EOSABS 0.1 12/23/2016 0213   BASOSABS 0.1 12/23/2016 0213   BMET    Component Value Date/Time   NA 150 (H) 12/24/2016 0220   K 3.5 12/24/2016 0220   CL 119 (H) 12/24/2016 0220   CO2 25 12/24/2016 0220   GLUCOSE 157 (H) 12/24/2016 0220   BUN 35 (H) 12/24/2016 0220   CREATININE 0.92 12/24/2016 0220   CALCIUM 8.4 (L) 12/24/2016 0220   GFRNONAA >60 12/24/2016 0220   GFRAA >60 12/24/2016 0220   Culture: BCx 1/7 : strep pneumonia UCx 1/7 >> No growth Expectorated Cx 1/8 >> normal Respiratory Cx 1/8 >> normal Respiratory Cx 1/10 >> rare candida albicans  Urine 1/12 > no growth Blood 1/12 >  BAL 1/12 >   Lines/Tubes: ETT 1/9 >> OGT 1/9 >> Foley 1/10 >>  Imaging: CXR 1/11 >Lines and tubes in stable position. Bilateral pulmonary infiltrates with  partial clearing from prior exam  Antibiotics:  Azithromycin 1/7 >>1/13 Ceftriaxone 1/7 >>1/13 Vanc 1/8>1/8  Impression/Plan: Mr. Lahaie is a 56 yo male presenting with acute respiratory failure and ARDS due to influenza infection complicated by severe CAP, requiring intubation.  ASSESSMENT / PLAN:  PULMONARY A: Acute Respiratory Failure with Hypoxemia 2/2 Severe CAP  ARDS > progressing as normally expected P:   ARDS Protocol Vent settings PCV 550/40/24/5 SBT Likely extubation soon  VAP prevention measures Thorazine prn hiccups  CARDIOVASCULAR A:  Pulmonary Edema > resolved P:  Tele monitoring  RENAL A:   Rising Cr in setting of lasix, improved after lasix stopped Hypernatremia P:   Monitor BMET and UOP Replace electrolytes as needed Hold lasix Free water    GASTROINTESTINAL A:   Constipation> improving P:   Famotidine TF Laxatives PRN  HEMATOLOGIC A:   Microcytic Anemia- new, Hgb decreasing from 12 > 9 P:  Monitor for bleeding DVT prophylaxis with sub q heparin  INFECTIOUS  A:   Severe CAP:  Continues to be febrile, WBC 14 > 15 > 12>10 P:   Completed Ceftriaxone and Azithromycin  course Monitor repeat cultures 1/12  ENDOCRINE A:   T2DM P:   CBGs Lantus SSI Novolog  NEUROLOGIC A:   Awake, alert on vent P:    RASS goal 0 Keep sedation down as tolerated   Albin Felling, MD, MPH Internal Medicine Resident, PGY-III Pager: [phone] 12/24/2016 6:45 AM

## 2016-12-24 NOTE — Progress Notes (Signed)
Pt fever starting to trend up again. Tylenol given per tube and ice packs applied. Will continue to monitor.

## 2016-12-25 ENCOUNTER — Inpatient Hospital Stay (HOSPITAL_COMMUNITY): Payer: 59

## 2016-12-25 DIAGNOSIS — R0902 Hypoxemia: Secondary | ICD-10-CM

## 2016-12-25 DIAGNOSIS — A419 Sepsis, unspecified organism: Secondary | ICD-10-CM

## 2016-12-25 LAB — CBC
HCT: 25.9 % — ABNORMAL LOW (ref 39.0–52.0)
Hemoglobin: 8.5 g/dL — ABNORMAL LOW (ref 13.0–17.0)
MCH: 27.5 pg (ref 26.0–34.0)
MCHC: 32.8 g/dL (ref 30.0–36.0)
MCV: 83.8 fL (ref 78.0–100.0)
PLATELETS: 424 10*3/uL — AB (ref 150–400)
RBC: 3.09 MIL/uL — ABNORMAL LOW (ref 4.22–5.81)
RDW: 15.8 % — AB (ref 11.5–15.5)
WBC: 8.7 10*3/uL (ref 4.0–10.5)

## 2016-12-25 LAB — PHOSPHORUS: PHOSPHORUS: 2.5 mg/dL (ref 2.5–4.6)

## 2016-12-25 LAB — URINALYSIS, MICROSCOPIC (REFLEX)

## 2016-12-25 LAB — BASIC METABOLIC PANEL
Anion gap: 7 (ref 5–15)
Anion gap: 5 (ref 5–15)
BUN: 35 mg/dL — ABNORMAL HIGH (ref 6–20)
BUN: 26 mg/dL — AB (ref 6–20)
CALCIUM: 8.5 mg/dL — AB (ref 8.9–10.3)
CHLORIDE: 115 mmol/L — AB (ref 101–111)
CO2: 22 mmol/L (ref 22–32)
CO2: 24 mmol/L (ref 22–32)
CREATININE: 0.84 mg/dL (ref 0.61–1.24)
Calcium: 8.4 mg/dL — ABNORMAL LOW (ref 8.9–10.3)
Chloride: 119 mmol/L — ABNORMAL HIGH (ref 101–111)
Creatinine, Ser: 0.78 mg/dL (ref 0.61–1.24)
GFR calc Af Amer: >60 mL/min (ref >60)
GFR calc non Af Amer: >60 mL/min (ref >60)
GLUCOSE: 157 mg/dL — AB (ref 65–99)
Glucose, Bld: 182 mg/dL — ABNORMAL HIGH (ref 65–99)
Potassium: 3.1 mmol/L — ABNORMAL LOW (ref 3.5–5.1)
Potassium: 3.4 mmol/L — ABNORMAL LOW (ref 3.5–5.1)
SODIUM: 148 mmol/L — AB (ref 135–145)
Sodium: 144 mmol/L (ref 135–145)

## 2016-12-25 LAB — GLUCOSE, CAPILLARY
GLUCOSE-CAPILLARY: 142 mg/dL — AB (ref 65–99)
GLUCOSE-CAPILLARY: 170 mg/dL — AB (ref 65–99)
GLUCOSE-CAPILLARY: 172 mg/dL — AB (ref 65–99)
GLUCOSE-CAPILLARY: 191 mg/dL — AB (ref 65–99)
GLUCOSE-CAPILLARY: 192 mg/dL — AB (ref 65–99)
Glucose-Capillary: 127 mg/dL — ABNORMAL HIGH (ref 65–99)

## 2016-12-25 LAB — URINALYSIS, ROUTINE W REFLEX MICROSCOPIC
BILIRUBIN URINE: NEGATIVE
GLUCOSE, UA: 50 mg/dL — AB
KETONES UR: NEGATIVE mg/dL
LEUKOCYTES UA: NEGATIVE
Nitrite: NEGATIVE
PH: 6 (ref 5.0–8.0)
PROTEIN: 30 mg/dL — AB
Specific Gravity, Urine: 1.019 (ref 1.005–1.030)

## 2016-12-25 MED ORDER — INSULIN GLARGINE 100 UNIT/ML ~~LOC~~ SOLN
25.0000 [IU] | Freq: Every day | SUBCUTANEOUS | Status: DC
  Administered 2016-12-25 – 2016-12-28 (×4): 25 [IU] via SUBCUTANEOUS
  Filled 2016-12-25 (×4): qty 0.25

## 2016-12-25 MED ORDER — SODIUM CHLORIDE 0.9 % IV BOLUS (SEPSIS): 250.0000 mL | Freq: Once | INTRAVENOUS | Status: DC

## 2016-12-25 MED ORDER — SODIUM CHLORIDE 0.9 % IV SOLN
30.0000 meq | Freq: Once | INTRAVENOUS | Status: AC
  Administered 2016-12-25: 30 meq via INTRAVENOUS
  Filled 2016-12-25: qty 15

## 2016-12-25 MED ORDER — DEXTROSE 5 % IV SOLN
INTRAVENOUS | Status: DC
  Administered 2016-12-25: 100 mL via INTRAVENOUS
  Administered 2016-12-26: 02:00:00 via INTRAVENOUS

## 2016-12-25 MED ORDER — INSULIN GLARGINE 100 UNIT/ML ~~LOC~~ SOLN: 25.0000 [IU] | Freq: Every day | SUBCUTANEOUS | Status: DC

## 2016-12-25 MED ORDER — DEXTROSE 5 % IV SOLN: INTRAVENOUS | Status: DC

## 2016-12-25 MED ORDER — DEXTROSE 5 % IV SOLN
2.0000 g | Freq: Three times a day (TID) | INTRAVENOUS | Status: DC
  Administered 2016-12-25 – 2016-12-28 (×10): 2 g via INTRAVENOUS
  Filled 2016-12-25 (×13): qty 2

## 2016-12-25 MED ORDER — POTASSIUM CHLORIDE 20 MEQ/15ML (10%) PO SOLN
40.0000 meq | Freq: Once | ORAL | Status: AC
Start: 2016-12-25 — End: 2016-12-25
  Administered 2016-12-25: 40 meq
  Filled 2016-12-25: qty 30

## 2016-12-25 MED ORDER — ORAL CARE MOUTH RINSE
15.0000 mL | Freq: Two times a day (BID) | OROMUCOSAL | Status: DC
  Administered 2016-12-25 – 2016-12-26 (×2): 15 mL via OROMUCOSAL

## 2016-12-25 NOTE — Progress Notes (Signed)
[facility] PCCM  Subjective:  Fever 101 overnight. Weaning well.    Past Medical History:  Diagnosis Date  . GERD (gastroesophageal reflux disease)    OCCASIONALLY  . Phimosis   . Type 2 diabetes mellitus ([place])     O: Vitals:   12/25/16 0400 12/25/16 0407 12/25/16 0500 12/25/16 0600  BP: (!) 151/68  (!) 156/68 (!) 143/66  Pulse: (!) 54  (!) 55 (!) 55  Resp: 17  18 (!) 24  Temp:      TempSrc:      SpO2: 99%  100% 99%  Weight:  213 lb 3 oz (96.7 kg)    Height:       Gen: awake, on vent  HENT: [place]/AT, ETT in place   PULM: Few crackles at bases, vent supported breathing CV: RRR, no m/g/r GI:  BS+, soft, nontender Ext: No peripheral edema, moves all extremities  Neuro: alert, awake, following commands  CBC    Component Value Date/Time   WBC 8.7 12/25/2016 0239   RBC 3.09 (L) 12/25/2016 0239   HGB 8.5 (L) 12/25/2016 0239   HCT 25.9 (L) 12/25/2016 0239   PLT 424 (H) 12/25/2016 0239   MCV 83.8 12/25/2016 0239   MCH 27.5 12/25/2016 0239   MCHC 32.8 12/25/2016 0239   RDW 15.8 (H) 12/25/2016 0239   LYMPHSABS 1.5 12/23/2016 0213   MONOABS 0.5 12/23/2016 0213   EOSABS 0.1 12/23/2016 0213   BASOSABS 0.1 12/23/2016 0213   BMET    Component Value Date/Time   NA 148 (H) 12/25/2016 0239   K 3.1 (L) 12/25/2016 0239   CL 119 (H) 12/25/2016 0239   CO2 22 12/25/2016 0239   GLUCOSE 157 (H) 12/25/2016 0239   BUN 35 (H) 12/25/2016 0239   CREATININE 0.78 12/25/2016 0239   CALCIUM 8.5 (L) 12/25/2016 0239   GFRNONAA >60 12/25/2016 0239   GFRAA >60 12/25/2016 0239   Culture: BCx 1/7 : strep pneumonia UCx 1/7 >> No growth Expectorated Cx 1/8 >> normal Respiratory Cx 1/8 >> normal Respiratory Cx 1/10 >> rare candida albicans  Urine 1/12 > no growth Blood 1/12 >  BAL 1/12 > rare gram+ cocci in pairs, rare yeast  Lines/Tubes: ETT 1/9 >> OGT 1/9 >> Foley 1/10 >>  Imaging: CXR 1/11 >Lines and tubes in stable position. Bilateral pulmonary infiltrates with partial clearing from  prior exam  Antibiotics:  Azithromycin 1/7 >>1/13 Ceftriaxone 1/7 >>1/13 Vanc 1/8>1/8  Impression/Plan: Neil Murphy is a 56 yo male presenting with acute respiratory failure and ARDS due to influenza infection complicated by severe CAP, requiring intubation.  ASSESSMENT / PLAN:  PULMONARY A: Acute Respiratory Failure with Hypoxemia 2/2 Severe CAP  ARDS > progressing as normally expected P:   Vent support Wean 5/5 today Possible extubation today VAP prevention measures Thorazine prn hiccups  CARDIOVASCULAR A:  Pulmonary Edema > resolved P:  Tele monitoring  RENAL A:   Rising Cr in setting of lasix, improved after lasix stopped Hypernatremia- improving  Hypokalemia P:   Monitor BMET and UOP Replace electrolytes as needed Hold lasix Free water  D5 at 75 ml/hr, increase  bmet in pm and am   GASTROINTESTINAL A:   Constipation> resolved P:   Famotidine TF Laxatives PRN- dc  HEMATOLOGIC A:   Microcytic Anemia- new, Hgb decreasing from 12 > 9 Thrombocytosis- reactive  P:  Monitor for bleeding DVT prophylaxis with sub q heparin  INFECTIOUS  A:   Severe CAP:  Continues to be febrile, WBC 14 >  15 > 12>10 Fevers continued P:   Completed Ceftriaxone and Azithromycin course Add ceftaz empiric Re assess sputum  Monitor repeat cultures 1/12 UA CT chest   ENDOCRINE A:   T2DM P:   CBGs Lantus reduction SSI Novolog  NEUROLOGIC A:   Awake, alert on vent P:    RASS goal 0 Keep sedation down as tolerated   Albin Felling, MD, MPH Internal Medicine Resident, PGY-III Pager: [phone] 12/25/2016 6:57 AM   STAFF NOTE: Linwood Dibbles, MD FACP have personally reviewed patient's available data, including medical history, events of note, physical examination and test results as part of my evaluation. I have discussed with resident/NP and other care providers such as pharmacist, RN and RRT. In addition, I personally evaluated patient and elicited  key findings of: awakens, rass 0, fc, ronchi, no edema, fever continued, strep PNA organism, higher risk empyema, consider CT chest assessment, add empiric ceftaz for risk HCAP / nosocomial exposure, get UA, not culture of urine unless pos, no role echo as strep unlikely to seed valve, interrupt TF if weaning well, cpap 5 ps 5, goal 1 hour, correcting na, I updated family The patient is critically ill with multiple organ systems failure and requires high complexity decision making for assessment and support, frequent evaluation and titration of therapies, application of advanced monitoring technologies and extensive interpretation of multiple databases.   Critical Care Time devoted to patient care services described in this note is 35 Minutes. This time reflects time of care of this signee: Merrie Roof, MD FACP. This critical care time does not reflect procedure time, or teaching time or supervisory time of PA/NP/Med student/Med Resident etc but could involve care discussion time. Rest per NP/medical resident whose note is outlined above and that I agree with   Lavon Paganini. Titus Mould, MD, [place] Pgr: [facility] 12/25/2016 10:49 AM

## 2016-12-25 NOTE — Progress Notes (Signed)
Fentanyl 20cc wasted in sink. Witnessed by Oneita Jolly.

## 2016-12-25 NOTE — Procedures (Signed)
Extubation Procedure Note  Patient Details:   Name: Neil Murphy DOB: [birth year] MRN: [id number]   Airway Documentation:     Evaluation  O2 sats: stable throughout Complications: No apparent complications Patient did tolerate procedure well. Bilateral Breath Sounds: Rhonchi   Pt extubated per MD order placed on 4L [place] tolerating well.  Pt able to voice.  Ciro Backer 12/25/2016, 2:30 PM

## 2016-12-25 NOTE — Progress Notes (Signed)
*  PRELIMINARY RESULTS* Vascular Ultrasound Lower extremity venous duplex has been completed.  Preliminary findings: No evidence of DVT or baker's cyst.  Landry Mellow, RDMS, RVT  12/25/2016, 4:10 PM

## 2016-12-25 NOTE — Progress Notes (Signed)
Nutrition Follow-up  DOCUMENTATION CODES:   Obesity unspecified  INTERVENTION:    Diet advancement as able per MD. If swallowing problems suspected, recommend swallow evaluation with SLP.  NUTRITION DIAGNOSIS:   Inadequate oral intake related to inability to eat as evidenced by NPO status.  Ongoing  GOAL:   Patient will meet greater than or equal to 90% of their needs  Unmet  MONITOR:   Diet advancement, PO intake  ASSESSMENT:   55 yo male presenting with acute respiratory failure after recent influenza infection complicated by severe CAP, requiring intubation.  Patient was just extubated.  TF off. Labs reviewed: sodium 148, potassium 3.1 CBG's: [id number] Medications reviewed.  Diet Order:  Diet NPO time specified  Skin:  Reviewed, no issues  Last BM:  1/15  Height:   Ht Readings from Last 1 Encounters:  12/17/16 5\' 8"  (1.727 m)    Weight:   Wt Readings from Last 1 Encounters:  12/25/16 213 lb 3 oz (96.7 kg)    Ideal Body Weight:  70 kg  BMI:  Body mass index is 32.41 kg/m.  Estimated Nutritional Needs:   Kcal:  2000-2200  Protein:  100-120 gm  Fluid:  2-2.2 L  EDUCATION NEEDS:   No education needs identified at this time  Molli Barrows, [place], [place], [place] Pager [phone] After Hours Pager [phone]

## 2016-12-25 NOTE — Procedures (Signed)
Pt transported to CT on vent by RT, not myself, pt was placed on full support for transport, pt placed back on PS 5/5 at this time, tolerating well.  Sputum sample obtained and sent to lab.

## 2016-12-26 LAB — URINE CULTURE: CULTURE: NO GROWTH

## 2016-12-26 LAB — CULTURE, BLOOD (ROUTINE X 2)
CULTURE: NO GROWTH
CULTURE: NO GROWTH

## 2016-12-26 LAB — BASIC METABOLIC PANEL
ANION GAP: 6 (ref 5–15)
Anion gap: 8 (ref 5–15)
BUN: 19 mg/dL (ref 6–20)
BUN: 19 mg/dL (ref 6–20)
CALCIUM: 8.4 mg/dL — AB (ref 8.9–10.3)
CO2: 24 mmol/L (ref 22–32)
CO2: 24 mmol/L (ref 22–32)
Calcium: 8.3 mg/dL — ABNORMAL LOW (ref 8.9–10.3)
Chloride: 112 mmol/L — ABNORMAL HIGH (ref 101–111)
Chloride: 109 mmol/L (ref 101–111)
Creatinine, Ser: 0.74 mg/dL (ref 0.61–1.24)
Creatinine, Ser: 0.64 mg/dL (ref 0.61–1.24)
GFR calc Af Amer: >60 mL/min (ref >60)
GFR calc Af Amer: >60 mL/min (ref >60)
GLUCOSE: 180 mg/dL — AB (ref 65–99)
GLUCOSE: 144 mg/dL — AB (ref 65–99)
Potassium: 3.4 mmol/L — ABNORMAL LOW (ref 3.5–5.1)
Potassium: 3.6 mmol/L (ref 3.5–5.1)
Sodium: 142 mmol/L (ref 135–145)
Sodium: 141 mmol/L (ref 135–145)

## 2016-12-26 LAB — GLUCOSE, CAPILLARY
Glucose-Capillary: 182 mg/dL — ABNORMAL HIGH (ref 65–99)
Glucose-Capillary: 164 mg/dL — ABNORMAL HIGH (ref 65–99)
Glucose-Capillary: 147 mg/dL — ABNORMAL HIGH (ref 65–99)
Glucose-Capillary: 141 mg/dL — ABNORMAL HIGH (ref 65–99)
Glucose-Capillary: 131 mg/dL — ABNORMAL HIGH (ref 65–99)

## 2016-12-26 LAB — CBC
HCT: 27.3 % — ABNORMAL LOW (ref 39.0–52.0)
Hemoglobin: 8.8 g/dL — ABNORMAL LOW (ref 13.0–17.0)
MCH: 27.3 pg (ref 26.0–34.0)
MCHC: 32.2 g/dL (ref 30.0–36.0)
MCV: 84.8 fL (ref 78.0–100.0)
PLATELETS: 511 10*3/uL — AB (ref 150–400)
RBC: 3.22 MIL/uL — ABNORMAL LOW (ref 4.22–5.81)
RDW: 15.5 % (ref 11.5–15.5)
WBC: 9.4 10*3/uL (ref 4.0–10.5)

## 2016-12-26 LAB — PHOSPHORUS: Phosphorus: 2.8 mg/dL (ref 2.5–4.6)

## 2016-12-26 MED ORDER — POTASSIUM CHLORIDE CRYS ER 20 MEQ PO TBCR
40.0000 meq | EXTENDED_RELEASE_TABLET | Freq: Two times a day (BID) | ORAL | Status: AC
  Administered 2016-12-26 (×2): 40 meq via ORAL
  Filled 2016-12-26 (×2): qty 2

## 2016-12-26 NOTE — Progress Notes (Signed)
Pt transferred to 6N03 via bed from 35M.  Pt AAO X4.  Pt on 4L O2 via [place].  Pt has 20G to Lt FA and 18G to Lt hand SL.  Pt has condom cath to straight drain and rectal pouch in place.  Foam to sacrum in place.  Family and belongings to bedside.  Report rcvd from Jenny Reichmann, [place].  Will continue to monitor.

## 2016-12-26 NOTE — Progress Notes (Signed)
[facility] PCCM  Subjective:  Extubated yesterday, doing well off vent. Afebrile overnight. Denies SOB. Wants to eat.    Past Medical History:  Diagnosis Date  . GERD (gastroesophageal reflux disease)    OCCASIONALLY  . Phimosis   . Type 2 diabetes mellitus (HCC)     O: Vitals:   12/26/16 0400 12/26/16 0430 12/26/16 0437 12/26/16 0500  BP: 140/78   (!) 154/81  Pulse: 60 (!) 59  65  Resp: (!) 30 (!) 34  (!) 24  Temp:   99.3 F (37.4 C)   TempSrc:   Oral   SpO2: 98% 99%  98%  Weight:    208 lb 15.9 oz (94.8 kg)  Height:       Gen: awake, sitting up in bed, NAD HENT: [place]/AT, EOMI, mucus membranes moist  PULM: Coarse breath sounds bilaterally, breaths non-labored on O2 via [place] CV: RRR, no m/g/r GI:  BS+, soft, nontender Ext: No peripheral edema, moves all extremities  Neuro: alert and oriented x 3  CBC    Component Value Date/Time   WBC 9.4 12/26/2016 0234   RBC 3.22 (L) 12/26/2016 0234   HGB 8.8 (L) 12/26/2016 0234   HCT 27.3 (L) 12/26/2016 0234   PLT 511 (H) 12/26/2016 0234   MCV 84.8 12/26/2016 0234   MCH 27.3 12/26/2016 0234   MCHC 32.2 12/26/2016 0234   RDW 15.5 12/26/2016 0234   LYMPHSABS 1.5 12/23/2016 0213   MONOABS 0.5 12/23/2016 0213   EOSABS 0.1 12/23/2016 0213   BASOSABS 0.1 12/23/2016 0213   BMET    Component Value Date/Time   NA 144 12/25/2016 2017   K 3.4 (L) 12/25/2016 2017   CL 115 (H) 12/25/2016 2017   CO2 24 12/25/2016 2017   GLUCOSE 182 (H) 12/25/2016 2017   BUN 26 (H) 12/25/2016 2017   CREATININE 0.84 12/25/2016 2017   CALCIUM 8.4 (L) 12/25/2016 2017   GFRNONAA >60 12/25/2016 2017   GFRAA >60 12/25/2016 2017   Culture: BCx 1/7 : strep pneumonia UCx 1/7 >> No growth Expectorated Cx 1/8 >> normal Respiratory Cx 1/8 >> normal Respiratory Cx 1/10 >> rare candida albicans  Urine 1/12 > no growth Blood 1/12 >  BAL 1/12 > rare gram+ cocci in pairs, rare yeast Resp cx 1/16>>   Lines/Tubes: ETT 1/9 >>1/16 OGT 1/9 >>1/16 Foley 1/10  >>  Imaging: CXR 1/11 >Lines and tubes in stable position. Bilateral pulmonary infiltrates with partial clearing from prior exam CT chest  1/16>> multifocal PNA  Antibiotics:  Azithromycin 1/7 >>1/13 Ceftriaxone 1/7 >>1/13 Vanc 1/8>1/8 Ceftaz 1/16>>  Impression/Plan: Neil Murphy is a 56 yo male presenting with acute respiratory failure and ARDS due to influenza infection complicated by severe CAP, requiring intubation.  ASSESSMENT / PLAN:  PULMONARY A: Acute Respiratory Failure with Hypoxemia 2/2 Severe Strep pneumo CAP  ARDS > progressing as normally expected Extubated 1/16 P:   Monitor O2 sat, keep >92% Continue Ceftaz   CARDIOVASCULAR A:  Pulmonary Edema > resolved P:  Tele monitoring Restart Lasix   RENAL A:   Rising Cr in setting of lasix, improved after lasix stopped Hypernatremia- improved Hypokalemia P:   Monitor BMET and UOP Replace electrolytes as needed Restart lasix  Stop D5 and free water bmet in AM  GASTROINTESTINAL A:   Constipation> resolved P:   Famotidine Start diet   HEMATOLOGIC A:   Microcytic Anemia- new, Hgb decreasing from 12 > 9 Thrombocytosis- reactive  P:  Monitor for bleeding DVT prophylaxis with  sub q heparin  INFECTIOUS  A:   Severe S pneumo CAP Continued fevers- finally afebrile overnight P:   Completed Ceftriaxone and Azithromycin course Continue Ceftaz Follow up repeat sputum cx Monitor repeat cultures 1/12 UA neg  ENDOCRINE A:   T2DM P:   CBGs Lantus SSI Novolog Start diet  NEUROLOGIC A:   Awake, alert, no acute issues P:    None  PT eval  Transfer to floor, triad   Albin Felling, MD, MPH Internal Medicine Resident, PGY-III Pager: [phone] 12/26/2016 6:39 AM    STAFF NOTE: Neil Dibbles, MD FACP have personally reviewed patient's available data, including medical history, events of note, physical examination and test results as part of my evaluation. I have discussed with  resident/NP and other care providers such as pharmacist, RN and RRT. In addition, I personally evaluated patient and elicited key findings of: awake, extubated 1/16, no distress, need to ambulate and progress, no fevers after abx restart, CT neg empyema, with no fevers after restart ABX, will prolong IV abx x 3 more days then to oral for total duration 14 days given fever curve off abx, CT re assuring, diet added, k supp, chem in am , to triad, med floor, I updated family and pt in room   Neil Murphy. Neil Mould, MD, [place] Pgr: [facility] 12/26/2016 10:29 AM

## 2016-12-26 NOTE — Progress Notes (Signed)
Inpatient Diabetes Program Recommendations  AACE/ADA: New Consensus Statement on Inpatient Glycemic Control (2015)  Target Ranges:  Prepandial:   less than 140 mg/dL      Peak postprandial:   less than 180 mg/dL (1-2 hours)      Critically ill patients:  140 - 180 mg/dL   Lab Results  Component Value Date   GLUCAP 164 (H) 12/26/2016    Review of Glycemic Control:  Results for MARCQUIS, MALLIA (MRN [id number]) as of 12/26/2016 10:30  Ref. Range 12/25/2016 16:09 12/25/2016 19:26 12/25/2016 23:36 12/26/2016 04:36 12/26/2016 08:45  Glucose-Capillary Latest Ref Range: 65 - 99 mg/dL 191 (H) 172 (H) 170 (H) 182 (H) 164 (H)    Current orders for diabetes:  Novolog resistant q 4 hours, Novolog 7 units q 4 hours, Lantus 25 units daily  Inpatient Diabetes Program Recommendations:    Note patient extubated.  Please d/c Novolog tube feed coverage 7 units.  Also consider changing Novolog correction to tid with meals and HS.  No A1C currently available for patient.  May need insulin at d/c based on inpatient blood sugars.  Will follow.  Thanks, Adah Perl, RN, BC-ADM Inpatient Diabetes Coordinator Pager [phone] (8a-5p)

## 2016-12-26 NOTE — Evaluation (Signed)
Physical Therapy Evaluation Patient Details Name: Neil Murphy MRN: [id number] DOB: Oct 08, 1961 Today's Date: 12/26/2016   History of Present Illness  [name] is a 56 yo male presenting with acute respiratory failure and ARDS due to influenza infection complicated by severe CAP, requiring intubation. Extubated 12/26/16. PMH: DM2, GERD  Clinical Impression  Pt admitted with above diagnosis. Pt currently with functional limitations due to the deficits listed below (see PT Problem List). Pt very weak after prolonged period of sedation and bedrest. Mod A +2 for bed mobility and transfer to chair, pt too weak to ambulate further than chair this first time up. O2 sats stable on 4L O2. Anticipate good progress and ability to go home with HHPT but if progression is not as expected, other rehab options may need to be visited.  Pt will benefit from skilled PT to increase their independence and safety with mobility to allow discharge to the venue listed below.       Follow Up Recommendations Home health PT;Supervision for mobility/OOB    Equipment Recommendations  Rolling walker with 5" wheels    Recommendations for Other Services OT consult     Precautions / Restrictions Precautions Precautions: Fall Restrictions Weight Bearing Restrictions: No      Mobility  Bed Mobility Overal bed mobility: Needs Assistance Bed Mobility: Supine to Sit     Supine to sit: Mod assist     General bed mobility comments: vc's for sequencing, mod A for elevation of trunk and scooting hips to EOB  Transfers Overall transfer level: Needs assistance Equipment used: Rolling walker (2 wheeled) Transfers: Sit to/from Stand Sit to Stand: Mod assist;+2 physical assistance         General transfer comment: no buckling of knees, slow, labored movement with much support needed. vc's for hand placement with RW  Ambulation/Gait Ambulation/Gait assistance: Mod assist;+2 physical assistance Ambulation  Distance (Feet): 2 Feet Assistive device: Rolling walker (2 wheeled) Gait Pattern/deviations: Step-to pattern;Trunk flexed     General Gait Details: pivot steps to chair, pt with posterior lean, could not tolerate further ambulation  Stairs            Wheelchair Mobility    Modified Rankin (Stroke Patients Only)       Balance Overall balance assessment: Needs assistance Sitting-balance support: Single extremity supported Sitting balance-Leahy Scale: Fair   Postural control: Posterior lean Standing balance support: Bilateral upper extremity supported Standing balance-Leahy Scale: Poor                               Pertinent Vitals/Pain Pain Assessment: No/denies pain    Home Living Family/patient expects to be discharged to:: Private residence Living Arrangements: Spouse/significant other;Children Available Help at Discharge: Family;Available 24 hours/day Type of Home: House Home Access: Stairs to enter   [employer] of Steps: 1 Home Layout: One level Home Equipment: None Additional Comments: 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 One daughter home all the time presently, 74 yo    Prior Function Level of Independence: Independent               Hand Dominance        Extremity/Trunk Assessment   Upper Extremity Assessment Upper Extremity Assessment: Generalized weakness    Lower Extremity Assessment Lower Extremity Assessment: Generalized weakness    Cervical / Trunk Assessment Cervical / Trunk Assessment: Normal  Communication   Communication: No difficulties (decreased verbalization)  Cognition Arousal/Alertness: Lethargic;Suspect due to medications (sedated prolonged time) Behavior During Therapy: Flat affect Overall Cognitive Status: Impaired/Different from baseline Area of Impairment: Problem solving             Problem Solving: Slow processing;Requires verbal cues General Comments: expect  this is from prolonged sedation on vent and will resolve, but continue to monitor    General Comments General comments (skin integrity, edema, etc.): O2 sats remained stable on 4L O2, 90's. Desat to 87% when on RA for short period of time    Exercises General Exercises - Lower Extremity Ankle Circles/Pumps: AROM;Both;10 reps;Supine Long Arc Quad: AROM;Both;10 reps;Seated   Assessment/Plan    PT Assessment Patient needs continued PT services  PT Problem List Decreased strength;Decreased activity tolerance;Decreased balance;Decreased mobility;Decreased cognition;Decreased knowledge of use of DME;Decreased safety awareness;Decreased knowledge of precautions;Cardiopulmonary status limiting activity          PT Treatment Interventions DME instruction;Gait training;Stair training;Functional mobility training;Therapeutic activities;Therapeutic exercise;Balance training;Patient/family education;Cognitive remediation;Neuromuscular re-education    PT Goals (Current goals can be found in the Care Plan section)  Acute Rehab PT Goals Patient Stated Goal: return to home and work PT Goal Formulation: With patient/family Time For Goal Achievement: 01/09/17 Potential to Achieve Goals: Good    Frequency Min 3X/week   Barriers to discharge        Co-evaluation               End of Session Equipment Utilized During Treatment: Gait belt;Oxygen Activity Tolerance: Patient limited by fatigue Patient left: in chair;with call bell/phone within reach;with family/visitor present Nurse Communication: Mobility status         Time: [id number] PT Time Calculation (min) (ACUTE ONLY): 32 min   Charges:   PT Evaluation $PT Eval Moderate Complexity: 1 Procedure PT Treatments $Therapeutic Activity: 8-22 mins   PT G Codes:       Leighton Roach, PT  Acute Rehab Services  [place] 12/26/2016, 11:47 AM

## 2016-12-27 LAB — GLUCOSE, CAPILLARY
GLUCOSE-CAPILLARY: 115 mg/dL — AB (ref 65–99)
GLUCOSE-CAPILLARY: 141 mg/dL — AB (ref 65–99)
Glucose-Capillary: 129 mg/dL — ABNORMAL HIGH (ref 65–99)
Glucose-Capillary: 108 mg/dL — ABNORMAL HIGH (ref 65–99)
Glucose-Capillary: 164 mg/dL — ABNORMAL HIGH (ref 65–99)

## 2016-12-27 LAB — BASIC METABOLIC PANEL
ANION GAP: 7 (ref 5–15)
ANION GAP: 10 (ref 5–15)
BUN: 19 mg/dL (ref 6–20)
BUN: 21 mg/dL — ABNORMAL HIGH (ref 6–20)
CHLORIDE: 111 mmol/L (ref 101–111)
CHLORIDE: 103 mmol/L (ref 101–111)
CO2: 23 mmol/L (ref 22–32)
CO2: 24 mmol/L (ref 22–32)
Calcium: 8.5 mg/dL — ABNORMAL LOW (ref 8.9–10.3)
Calcium: 8.3 mg/dL — ABNORMAL LOW (ref 8.9–10.3)
Creatinine, Ser: 0.74 mg/dL (ref 0.61–1.24)
Creatinine, Ser: 0.82 mg/dL (ref 0.61–1.24)
GFR calc Af Amer: >60 mL/min (ref >60)
GFR calc Af Amer: >60 mL/min (ref >60)
Glucose, Bld: 125 mg/dL — ABNORMAL HIGH (ref 65–99)
Glucose, Bld: 187 mg/dL — ABNORMAL HIGH (ref 65–99)
POTASSIUM: 3.5 mmol/L (ref 3.5–5.1)
POTASSIUM: 3.5 mmol/L (ref 3.5–5.1)
Sodium: 141 mmol/L (ref 135–145)
Sodium: 137 mmol/L (ref 135–145)

## 2016-12-27 LAB — PHOSPHORUS: PHOSPHORUS: 2.5 mg/dL (ref 2.5–4.6)

## 2016-12-27 MED ORDER — INSULIN ASPART 100 UNIT/ML ~~LOC~~ SOLN: 0.0000 [IU] | Freq: Every day | SUBCUTANEOUS | Status: DC

## 2016-12-27 MED ORDER — GLUCERNA SHAKE PO LIQD
237.0000 mL | Freq: Two times a day (BID) | ORAL | Status: DC
  Administered 2016-12-27 – 2016-12-28 (×3): 237 mL via ORAL

## 2016-12-27 MED ORDER — INSULIN ASPART 100 UNIT/ML ~~LOC~~ SOLN: 0.0000 [IU] | Freq: Three times a day (TID) | SUBCUTANEOUS | Status: DC

## 2016-12-27 NOTE — Progress Notes (Signed)
On behalf of sharon powers,rn

## 2016-12-27 NOTE — Progress Notes (Signed)
SATURATION QUALIFICATIONS: (This note is used to comply with regulatory documentation for home oxygen)  Patient Saturations on Room Air at Rest = 91%  Patient Saturations on Room Air while Ambulating = 70%  Patient Saturations on 3 Liters of oxygen while Ambulating = 90%  Please briefly explain why patient needs home oxygen: Pt desats without oxygen while ambulating.  Tonia Brooms, SPT [phone]

## 2016-12-27 NOTE — Progress Notes (Signed)
Nutrition Follow-up  DOCUMENTATION CODES:   Obesity unspecified  INTERVENTION:   -Glucerna Shake po BID, each supplement provides 220 kcal and 10 grams of protein  NUTRITION DIAGNOSIS:   Inadequate oral intake related to poor appetite as evidenced by meal completion < 50%, per patient/family report.  Progressing  GOAL:   Patient will meet greater than or equal to 90% of their needs  Progressing  MONITOR:   PO intake, Supplement acceptance, Labs, Weight trends, Skin, I & O's  REASON FOR ASSESSMENT:   Consult Enteral/tube feeding initiation and management  ASSESSMENT:   56 yo male presenting with acute respiratory failure after recent influenza infection complicated by severe CAP, requiring intubation.  Pt transferred from ICU to surgical floor on 12/26/16.   Spoke with pt and family members at bedside. Pt wife reports poor appetite for 1 week PTA related to chills, however, typically eats very well. They feels like appetite is coming back, however, still no 100%. Pt complains of some discomfort from intubation, however, denies swallow troubles.   Per pt daughter, pt is eating, "but not a whole lot". Estimated pt consuming about 50% of meals. They were requesting outside food for pt. Educated pt and family on principles of carb modified diet; discouraged outside food, however, gave examples of snacks and food items that were appropriate for pt. Pt amenable to Glucerna supplement.   Labs reviewed: CBGS: 108-129.   Diet Order:  Diet Carb Modified Fluid consistency: Thin; Room service appropriate? Yes  Skin:  Reviewed, no issues  Last BM:  12/26/16  Height:   Ht Readings from Last 1 Encounters:  12/17/16 5\' 8"  (1.727 m)    Weight:   Wt Readings from Last 1 Encounters:  12/27/16 201 lb 15.1 oz (91.6 kg)    Ideal Body Weight:  70 kg  BMI:  Body mass index is 30.71 kg/m.  Estimated Nutritional Needs:   Kcal:  2000-2200  Protein:  100-120 gm  Fluid:  2-2.2  L  EDUCATION NEEDS:   Education needs addressed  Lekeshia Kram A. Jimmye Norman, RD, LDN, CDE Pager: [phone] After hours Pager: [phone]

## 2016-12-27 NOTE — Progress Notes (Signed)
PROGRESS NOTE    Neil Murphy  [id number] DOB: 1961-09-09 DOA: 12/16/2016 PCP: Anthoney Harada, MD  Brief Narrative:  56 yo male presenting with acute respiratory failure and ARDS due to influenza infection complicated by severe CAP, requiring intubation   Assessment & Plan:    Sepsis ([place]) - Follow-up with cultures. Currently negative. Respiratory culture pending. - Sepsis physiology resolved   Active Problems:   Community acquired pneumonia - Follow-up with respiratory cultures. Continue current antibiotic regimen. Patient reports improvement in respiratory condition    Hypoxia - Secondary to problem listed above. Continue supplemental oxygen as needed   DVT prophylaxis: Heparin Code Status: Full Family Communication: Discussed with patient and family members at bedside Disposition Plan: Pending improvement in condition   Consultants:   none   Procedures: Status post extubation   Antimicrobials: Ceftazidime   Subjective: Pt has no new complaints. No acute issues overnight.  Objective: Vitals:   12/26/16 1532 12/26/16 2200 12/27/16 0409 12/27/16 1400  BP: (!) 144/77 (!) 148/76 (!) 146/74 120/80  Pulse: 70 76 68 79  Resp: (!) 25 20 20 20   Temp: 98.9 F (37.2 C) 97.9 F (36.6 C) 99 F (37.2 C) 98.6 F (37 C)  TempSrc: Oral Oral Oral Oral  SpO2: 97% 98% 97% 98%  Weight:   91.6 kg (201 lb 15.1 oz)   Height:        Intake/Output Summary (Last 24 hours) at 12/27/16 1749 Last data filed at 12/27/16 1500  Gross per 24 hour  Intake              530 ml  Output                0 ml  Net              530 ml   Filed Weights   12/25/16 0407 12/26/16 0500 12/27/16 0409  Weight: 96.7 kg (213 lb 3 oz) 94.8 kg (208 lb 15.9 oz) 91.6 kg (201 lb 15.1 oz)    Examination:  General exam: Appears calm and comfortable  Respiratory system: Clear to auscultation. Respiratory effort normal. Cardiovascular system: S1 & S2 heard, RRR. No JVD, murmurs, rubs,  gallops or clicks. No pedal edema. Gastrointestinal system: Abdomen is nondistended, soft and nontender. No organomegaly or masses felt. Normal bowel sounds heard. Central nervous system: Alert and oriented. No focal neurological deficits. Extremities: Symmetric 5 x 5 power. Skin: No rashes, lesions or ulcers Psychiatry: Judgement and insight appear normal. Mood & affect appropriate.   Data Reviewed: I have personally reviewed following labs and imaging studies  CBC:  Recent Labs Lab 12/22/16 0402 12/23/16 0213 12/24/16 0220 12/25/16 0239 12/26/16 0234  WBC 14.3* 12.4* 10.6* 8.7 9.4  NEUTROABS  --  10.2*  --   --   --   HGB 9.2* 8.7* 8.6* 8.5* 8.8*  HCT 27.1* 26.4* 27.0* 25.9* 27.3*  MCV 82.1 83.5 84.6 83.8 84.8  PLT 214 259 355 424* [id number]*   Basic Metabolic Panel:  Recent Labs Lab 12/21/16 0311 12/22/16 0402 12/23/16 0213  12/24/16 0220  12/25/16 0239 12/25/16 2017 12/26/16 0234 12/26/16 0735 12/26/16 2015 12/27/16 0521 12/27/16 0813  NA 146* 149* 150*  < > 150*  < > 148* 144  --  142 141  --  141  K 3.7 3.4* 3.6  < > 3.5  < > 3.1* 3.4*  --  3.4* 3.6  --  3.5  CL 114* 116* 118*  < > 119*  < >  119* 115*  --  112* 109  --  111  CO2 23 26 26   < > 25  < > 22 24  --  24 24  --  23  GLUCOSE 241* 182* 162*  < > 157*  < > 157* 182*  --  180* 144*  --  125*  BUN 30* 39* 34*  < > 35*  < > 35* 26*  --  19 19  --  19  CREATININE 1.35* 1.28* 1.02  < > 0.92  < > 0.78 0.84  --  0.74 0.64  --  0.74  CALCIUM 8.3* 8.4* 8.3*  < > 8.4*  < > 8.5* 8.4*  --  8.3* 8.4*  --  8.5*  MG 1.8 2.1 2.1  --   --   --   --   --   --   --   --   --   --   PHOS 2.5 3.0 2.9  --  2.7  --  2.5  --  2.8  --   --  2.5  --   < > = values in this interval not displayed. GFR: Estimated Creatinine Clearance: 116 mL/min (by C-G formula based on SCr of 0.74 mg/dL). Liver Function Tests: No results for input(s): AST, ALT, ALKPHOS, BILITOT, PROT, ALBUMIN in the last 168 hours. No results for input(s): LIPASE,  AMYLASE in the last 168 hours. No results for input(s): AMMONIA in the last 168 hours. Coagulation Profile: No results for input(s): INR, PROTIME in the last 168 hours. Cardiac Enzymes: No results for input(s): CKTOTAL, CKMB, CKMBINDEX, TROPONINI in the last 168 hours. BNP (last 3 results) No results for input(s): PROBNP in the last 8760 hours. HbA1C: No results for input(s): HGBA1C in the last 72 hours. CBG:  Recent Labs Lab 12/26/16 2117 12/27/16 0458 12/27/16 0752 12/27/16 1251 12/27/16 1656  GLUCAP 131* 129* 108* 115* 141*   Lipid Profile: No results for input(s): CHOL, HDL, LDLCALC, TRIG, CHOLHDL, LDLDIRECT in the last 72 hours. Thyroid Function Tests: No results for input(s): TSH, T4TOTAL, FREET4, T3FREE, THYROIDAB in the last 72 hours. Anemia Panel: No results for input(s): VITAMINB12, FOLATE, FERRITIN, TIBC, IRON, RETICCTPCT in the last 72 hours. Sepsis Labs: No results for input(s): PROCALCITON, LATICACIDVEN in the last 168 hours.  Recent Results (from the past 240 hour(s))  Culture, expectorated sputum-assessment     Status: None   Collection Time: 12/17/16 11:19 PM  Result Value Ref Range Status   Specimen Description EXPECTORATED SPUTUM  Final   Special Requests Normal  Final   Sputum evaluation THIS SPECIMEN IS ACCEPTABLE FOR SPUTUM CULTURE  Final   Report Status 12/18/2016 FINAL  Final  Culture, respiratory (NON-Expectorated)     Status: None   Collection Time: 12/17/16 11:19 PM  Result Value Ref Range Status   Specimen Description EXPECTORATED SPUTUM  Final   Special Requests Normal Reflexed from [id number]  Final   Gram Stain   Final    FEW SQUAMOUS EPITHELIAL CELLS PRESENT FEW WBC PRESENT, PREDOMINANTLY PMN FEW GRAM POSITIVE COCCI IN PAIRS RARE YEAST    Culture Consistent with normal respiratory flora.  Final   Report Status 12/20/2016 FINAL  Final  Culture, respiratory (NON-Expectorated)     Status: None   Collection Time: 12/19/16  3:34 PM  Result  Value Ref Range Status   Specimen Description TRACHEAL ASPIRATE  Final   Special Requests NONE  Final   Gram Stain   Final    ABUNDANT  WBC PRESENT,BOTH PMN AND MONONUCLEAR NO ORGANISMS SEEN    Culture RARE CANDIDA ALBICANS  Final   Report Status 12/22/2016 FINAL  Final  Culture, respiratory (NON-Expectorated)     Status: None   Collection Time: 12/21/16 11:26 AM  Result Value Ref Range Status   Specimen Description TRACHEAL ASPIRATE  Final   Special Requests NONE  Final   Gram Stain   Final    ABUNDANT WBC PRESENT, PREDOMINANTLY PMN RARE GRAM POSITIVE COCCI IN PAIRS RARE YEAST    Culture Consistent with normal respiratory flora.  Final   Report Status 12/23/2016 FINAL  Final  Culture, blood (routine x 2)     Status: None   Collection Time: 12/21/16 11:40 AM  Result Value Ref Range Status   Specimen Description BLOOD RIGHT ANTECUBITAL  Final   Special Requests BOTTLES DRAWN AEROBIC ONLY [place]  Final   Culture NO GROWTH 5 DAYS  Final   Report Status 12/26/2016 FINAL  Final  Culture, blood (routine x 2)     Status: None   Collection Time: 12/21/16 11:48 AM  Result Value Ref Range Status   Specimen Description BLOOD RIGHT ARM  Final   Special Requests IN PEDIATRIC BOTTLE 3CC  Final   Culture NO GROWTH 5 DAYS  Final   Report Status 12/26/2016 FINAL  Final  Culture, Urine     Status: None   Collection Time: 12/21/16 12:45 PM  Result Value Ref Range Status   Specimen Description URINE, RANDOM  Final   Special Requests NONE  Final   Culture NO GROWTH  Final   Report Status 12/22/2016 FINAL  Final  Culture, respiratory (NON-Expectorated)     Status: None (Preliminary result)   Collection Time: 12/25/16 12:18 PM  Result Value Ref Range Status   Specimen Description TRACHEAL ASPIRATE  Final   Special Requests NONE  Final   Gram Stain   Final    FEW WBC PRESENT, PREDOMINANTLY PMN RARE GRAM NEGATIVE RODS RARE GRAM POSITIVE RODS    Culture CULTURE REINCUBATED FOR BETTER GROWTH   Final   Report Status PENDING  Incomplete  Urine culture     Status: None   Collection Time: 12/25/16 12:38 PM  Result Value Ref Range Status   Specimen Description URINE, RANDOM  Final   Special Requests NONE  Final   Culture NO GROWTH  Final   Report Status 12/26/2016 FINAL  Final     Radiology Studies: No results found.   Scheduled Meds: . cefTAZidime (FORTAZ)  IV  2 g Intravenous Q8H  . feeding supplement (GLUCERNA SHAKE)  237 mL Oral BID BM  . heparin  5,000 Units Subcutaneous Q8H  . insulin aspart  0-20 Units Subcutaneous Q4H  . insulin glargine  25 Units Subcutaneous Daily  . mouth rinse  15 mL Mouth Rinse BID   Continuous Infusions:   LOS: 11 days   Time spent: > 35 minutes  Velvet Bathe, MD [facility] Pager [phone]  If 7PM-7AM, please contact night-coverage [email or web address] Password [facility] 12/27/2016, 5:49 PM

## 2016-12-27 NOTE — Progress Notes (Signed)
Physical Therapy Treatment Patient Details Name: Neil Murphy MRN: [id number] DOB: 05/19/1961 Today's Date: 12/27/2016    History of Present Illness Neil Murphy is a 56 yo male presenting with acute respiratory failure and ARDS due to influenza infection complicated by severe CAP, requiring intubation. Extubated 12/26/16. PMH: DM2, GERD    PT Comments    Pt with increased activity tolerance. Was able to ambulate 50 ft with min guard for safety. Pt became notably SOB after 25 ft of ambulation and required a standing rest break. Pt demonstrated increasingly poor posture (trunk flexed) with increased fatigue. Educated pt and family on pursed lip breathing and use of incentive spirometer throughout the day. Pt motivated to participate in therapy. Pt continues to be a good candidate for HHPT to address decreased endurance and strength. PT will continue to follow acutely.   Follow Up Recommendations  Home health PT;Supervision for mobility/OOB     Equipment Recommendations  Rolling walker with 5" wheels    Recommendations for Other Services OT consult     Precautions / Restrictions Precautions Precautions: Fall Restrictions Weight Bearing Restrictions: No    Mobility  Bed Mobility               General bed mobility comments: Pt in chair on PT arrival  Transfers Overall transfer level: Needs assistance Equipment used: Rolling walker (2 wheeled) Transfers: Sit to/from Stand Sit to Stand: Min guard         General transfer comment: No physical assist required. Min guard for safety.  Ambulation/Gait Ambulation/Gait assistance: Min guard Ambulation Distance (Feet): 50 Feet (2x25) Assistive device: Rolling walker (2 wheeled) Gait Pattern/deviations: Trunk flexed;Step-through pattern Gait velocity: decreased Gait velocity interpretation: Below normal speed for age/gender General Gait Details: Min Guard for safety. Pt becomes SOB after 25 ft of ambulation and required  standing rest break. SpO2 drops without oxygen (70%- increased to 91% with 3L O2).    Stairs            Wheelchair Mobility    Modified Rankin (Stroke Patients Only)       Balance Overall balance assessment: Needs assistance Sitting-balance support: No upper extremity supported;Feet supported Sitting balance-Leahy Scale: Good     Standing balance support: Bilateral upper extremity supported Standing balance-Leahy Scale: Poor Standing balance comment: Reliant on BUE for standing balance                    Cognition Arousal/Alertness: Awake/alert Behavior During Therapy: WFL for tasks assessed/performed Overall Cognitive Status: Within Functional Limits for tasks assessed                      Exercises      General Comments        Pertinent Vitals/Pain Pain Assessment: No/denies pain    Home Living                      Prior Function            PT Goals (current goals can now be found in the care plan section) Acute Rehab PT Goals Patient Stated Goal: return to home and work PT Goal Formulation: With patient/family Time For Goal Achievement: 01/09/17 Potential to Achieve Goals: Good Progress towards PT goals: Progressing toward goals    Frequency    Min 3X/week      PT Plan Current plan remains appropriate    Co-evaluation  End of Session Equipment Utilized During Treatment: Gait belt;Oxygen Activity Tolerance: Patient tolerated treatment well Patient left: in chair;with call bell/phone within reach;with family/visitor present     Time: [id number] PT Time Calculation (min) (ACUTE ONLY): 16 min  Charges:  $Gait Training: 8-22 mins                    G Codes:      Neil Murphy 01-14-2017, 4:05 PM Neil Murphy, SPT [phone]

## 2016-12-27 NOTE — Care Management Note (Addendum)
Case Management Note  Patient Details  Name: Neil Murphy MRN: [id number] Date of Birth: [birth year]  Subjective/Objective:      Respiratory failure with ARDS d/t influenza infection, CAP              Action/Plan: Discharge Planning: Spoke to pt and wife at bedside. Offered choice for [facility] PT/provided HH list. Requested [facility] for HH. Contacted [facility] Liaison with new referral. Pt requesting RW for home. DME rep notified. Will check walking oxygen sats day of dc to see if pt will need oxygen for home. Pt currently on 3L [place] and weaning. Pt's wife will take his FMLA paperwork to [facility] [employer] to complete. His job is aware of hospital stay.    PCP Ulanda Edison MD  12/28/2016 Contacted [place] DME rep for oxygen for home. Contacted [place] Liaison to make aware pt dc home with [facility].    Expected Discharge Date:    12/28/2016             Expected Discharge Plan:  [place]  In-House Referral:  NA  Discharge planning Services  CM Consult  Post Acute Care Choice:  Home Health Choice offered to:  Spouse  DME Arranged:  Walker rolling, oxygen DME Agency:  [place] Arranged:  PT [facility] Agency:  [place]  Status of Service:  Completed, signed off  If discussed at [place] of Stay Meetings, dates discussed:    Additional Comments:  Erenest Rasher, RN 12/27/2016, 12:26 PM

## 2016-12-28 LAB — PHOSPHORUS: Phosphorus: 2.7 mg/dL (ref 2.5–4.6)

## 2016-12-28 LAB — BASIC METABOLIC PANEL
Anion gap: 9 (ref 5–15)
BUN: 16 mg/dL (ref 6–20)
CHLORIDE: 109 mmol/L (ref 101–111)
CO2: 23 mmol/L (ref 22–32)
CREATININE: 0.78 mg/dL (ref 0.61–1.24)
Calcium: 8.5 mg/dL — ABNORMAL LOW (ref 8.9–10.3)
GFR calc Af Amer: >60 mL/min (ref >60)
Glucose, Bld: 130 mg/dL — ABNORMAL HIGH (ref 65–99)
Potassium: 3.3 mmol/L — ABNORMAL LOW (ref 3.5–5.1)
SODIUM: 141 mmol/L (ref 135–145)

## 2016-12-28 LAB — CULTURE, RESPIRATORY W GRAM STAIN: Culture: NORMAL

## 2016-12-28 LAB — GLUCOSE, CAPILLARY
GLUCOSE-CAPILLARY: 177 mg/dL — AB (ref 65–99)
Glucose-Capillary: 121 mg/dL — ABNORMAL HIGH (ref 65–99)

## 2016-12-28 LAB — CULTURE, RESPIRATORY

## 2016-12-28 MED ORDER — CEFDINIR 300 MG PO CAPS: 300.0000 mg | ORAL_CAPSULE | Freq: Two times a day (BID) | ORAL | 0 refills | Status: AC

## 2016-12-28 MED ORDER — GLUCERNA SHAKE PO LIQD: 237.0000 mL | Freq: Two times a day (BID) | ORAL | 0 refills | Status: AC

## 2016-12-28 MED ORDER — INSULIN ASPART 100 UNIT/ML ~~LOC~~ SOLN
0.0000 [IU] | Freq: Three times a day (TID) | SUBCUTANEOUS | Status: DC
  Administered 2016-12-28: 3 [IU] via SUBCUTANEOUS
  Administered 2016-12-28: 4 [IU] via SUBCUTANEOUS

## 2016-12-28 NOTE — Progress Notes (Addendum)
SATURATION QUALIFICATIONS: (This note is used to comply with regulatory documentation for home oxygen)  Patient Saturations on Room Air at Rest = 91%  Patient Saturations on Room Air while Ambulating = 79%  Patient Saturations on 2 Liters of oxygen while Ambulating = 92%  Please briefly explain why patient needs home oxygen:  1700 Discharge instructions given to pt, verbalized understanding. Discharged to home on portable O2 at 2li,  accompanied by family.

## 2016-12-28 NOTE — Discharge Summary (Signed)
Physician Discharge Summary  Neil Murphy [id number] DOB: May 11, 1961 DOA: 12/16/2016  PCP: Anthoney Harada, MD  Admit date: 12/16/2016 Discharge date: 12/28/2016  Time spent: 35 minutes  Recommendations for Outpatient Follow-up:  Patient to complete a total of 14 days of antibiotic treatment. Recommended patient check blood sugars at least 2 times daily preprandial and postprandial. Monitor blood sugars and adjust hypoglycemic agents accordingly  Discharge Diagnoses:  Active Problems:   Community acquired pneumonia   Flu   Hypoxia   Encounter for intubation   Acute respiratory failure with hypoxemia (HCC)   ARDS (adult respiratory distress syndrome) ([place])   Sepsis ([place])   Discharge Condition: Stable  Diet recommendation: Carb modified diet  Filed Weights   12/25/16 0407 12/26/16 0500 12/27/16 0409  Weight: 96.7 kg (213 lb 3 oz) 94.8 kg (208 lb 15.9 oz) 91.6 kg (201 lb 15.1 oz)    History of present illness:  56 yo male presenting with acute respiratory failure and ARDS due to influenza infection complicated by severe CAP, requiring intubation  Hospital Course:  Sepsis ([place]) - Follow-up with cultures. Currently negative. Respiratory culture pending. - Sepsis physiology resolved, repeat blood culture negative   Active Problems:   Community acquired pneumonia - Plan is to continue antibiotics with third generation cephalosporin for another 4 days to complete a 14 day treatment total    Hypoxia  - Resolved with antibiotic therapy  Diabetes mellitus - Plan is to continue carb modified diet - We'll continue metformin with recommended follow-up with primary care physician for further evaluation recommendations  Procedures:  None  Consultations:  Critical care  Discharge Exam: Vitals:   12/27/16 2046 12/28/16 0605  BP: 133/73 135/71  Pulse: 87 79  Resp: 18 18  Temp: 98.7 F (37.1 C) 98.8 F (37.1 C)    General: Patient in no acute distress,  alert and awake Cardiovascular: Regular rate and rhythm, no murmurs or rubs Respiratory: No increased work of breathing, no wheezes  Discharge Instructions   Discharge Instructions    Call MD for:  difficulty breathing, headache or visual disturbances    Complete by:  As directed    Call MD for:  temperature >100.4    Complete by:  As directed    Diet - low sodium heart healthy    Complete by:  As directed    Diet Carb Modified    Complete by:  As directed    Increase activity slowly    Complete by:  As directed      Current Discharge Medication List    START taking these medications   Details  cefdinir (OMNICEF) 300 MG capsule Take 1 capsule (300 mg total) by mouth 2 (two) times daily. Qty: 8 capsule, Refills: 0    feeding supplement, GLUCERNA SHAKE, (GLUCERNA SHAKE) LIQD Take 237 mLs by mouth 2 (two) times daily between meals. Refills: 0      CONTINUE these medications which have NOT CHANGED   Details  metFORMIN (GLUCOPHAGE) 1000 MG tablet Take 1,000 mg by mouth 2 (two) times daily.       No Known Allergies    The results of significant diagnostics from this hospitalization (including imaging, microbiology, ancillary and laboratory) are listed below for reference.    Significant Diagnostic Studies: Ct Chest Wo Contrast  Result Date: 12/25/2016 CLINICAL DATA:  Fever and sepsis. EXAM: CT CHEST WITHOUT CONTRAST TECHNIQUE: Multidetector CT imaging of the chest was performed following the standard protocol without IV contrast. COMPARISON:  Chest x-ray 12/23/2016  FINDINGS: Cardiovascular: Heart is enlarged. No substantial pericardial effusion. No thoracic aortic aneurysm. Mediastinum/Nodes: Endotracheal tube noted with tip above the carina. NG tube passes into the stomach although distal tip is not visualized. No mediastinal lymphadenopathy. No evidence for gross hilar lymphadenopathy although assessment is limited by the lack of intravenous contrast on today's study. There  is no axillary lymphadenopathy. Lungs/Pleura: Dense bilateral lower lobe collapse/ consolidation is associated with central patchy airspace disease in the right upper and middle lobes. There is some confluent airspace consolidation in the lingula as well. No substantial pleural effusion. Upper Abdomen: Unremarkable. Musculoskeletal: Bone windows reveal no worrisome lytic or sclerotic osseous lesions. IMPRESSION: 1. Dense bilateral lower lobe airspace consolidation with patchy airspace disease in the right middle lobe and lingula. Imaging features compatible with multifocal pneumonia. Electronically Signed   By: Misty Stanley M.D.   On: 12/25/2016 11:56   Dg Chest Port 1 View  Result Date: 12/25/2016 CLINICAL DATA:  Intubation. EXAM: PORTABLE CHEST 1 VIEW COMPARISON:  CT 12/25/2016.  Chest x-ray 12/23/2016 . FINDINGS: Endotracheal tube and NG tube noted in stable position. Stable cardiomegaly. Progressive bilateral pulmonary infiltrates are noted consistent with progressive multifocal pneumonia and/or pulmonary edema. Bilateral pleural effusion noted on today's exam. No pneumothorax. IMPRESSION: 1. Lines and tubes in stable position. 2. Stable cardiomegaly. Progressive bilateral pulmonary infiltrates consistent with progressive multifocal pneumonia and/or pulmonary edema. Bilateral pleural effusions noted on today's exam . Electronically Signed   By: Marcello Moores  Register   On: 12/25/2016 12:43   Dg Chest Port 1 View  Result Date: 12/23/2016 CLINICAL DATA:  Acute respiratory failure with hypoxemia. EXAM: PORTABLE CHEST 1 VIEW COMPARISON:  12/21/2016. FINDINGS: Endotracheal tube in satisfactory position. Nasogastric tube extending into the stomach. Stable enlarged cardiac silhouette and bilateral patchy airspace opacity. Unremarkable bones. IMPRESSION: Stable cardiomegaly and bilateral pneumonia. Electronically Signed   By: Claudie Revering M.D.   On: 12/23/2016 07:16   Dg Chest Port 1 View  Result Date:  12/21/2016 CLINICAL DATA:  Acute respiratory failure with hypoxia. EXAM: PORTABLE CHEST 1 VIEW COMPARISON:  12/20/2016 FINDINGS: Endotracheal tube and NG tube appear in good position. Slight progression of the bilateral pulmonary infiltrates, slightly worse on the left than the right. Pulmonary vascularity is normal. No effusions. IMPRESSION: Slight progression of bilateral pulmonary infiltrates. Electronically Signed   By: Lorriane Shire M.D.   On: 12/21/2016 08:39   Dg Chest Port 1 View  Result Date: 12/20/2016 CLINICAL DATA:  Intubation . EXAM: PORTABLE CHEST 1 VIEW COMPARISON:  12/19/2016. FINDINGS: Endotracheal tube and NG tube in stable position. Bilateral pulmonary infiltrates, improved from prior exam. No pleural effusion or pneumothorax . IMPRESSION: 1. Lines and tubes in stable position. 2. Bilateral pulmonary infiltrates with partial clearing from prior exam. Electronically Signed   By: Marcello Moores  Register   On: 12/20/2016 06:54   Dg Chest Port 1 View  Result Date: 12/18/2016 CLINICAL DATA:  56 year old male intubated. Altered mental status. Productive cough, shortness of breath, low-grade fever. Initial encounter. EXAM: PORTABLE CHEST 1 VIEW COMPARISON:  1331 hours today. FINDINGS: Portable AP semi upright view at 1429 hours. Endotracheal tube in place, tip in good position between the level the clavicles and carina. Continued confluent mid and lower lung airspace opacity bilaterally. Probable superimposed bilateral lower lobe collapse. No large pleural effusion. No pneumothorax or pulmonary edema. Stable cardiac size and mediastinal contours. IMPRESSION: 1. Intubated, endotracheal tube tip in good position. 2. Confluent bilateral pulmonary opacity suspicious for bilateral pneumonia with superimposed lower  lobe collapse or consolidation. No large pleural effusion. Electronically Signed   By: Genevie Ann M.D.   On: 12/18/2016 14:40   Dg Chest Port 1 View  Result Date: 12/18/2016 CLINICAL DATA:  Flu on  Friday. Productive cough. Low grade fever /SOB EXAM: PORTABLE CHEST 1 VIEW COMPARISON:  12/16/2016 FINDINGS: The heart is enlarged but also accentuated by the shallow lung inflation and AP technique. There are diffuse bilateral infiltrates, significantly increased compared with prior. Opacity at the left lung base completely obscures the left hemidiaphragm. Suspect bilateral pleural effusions, left greater than right. IMPRESSION: Significant increase in bilateral airspace filling opacities. Electronically Signed   By: Nolon Nations M.D.   On: 12/18/2016 13:42   Dg Chest Port 1 View  Result Date: 12/16/2016 CLINICAL DATA:  Shortness of breath and fever EXAM: PORTABLE CHEST 1 VIEW COMPARISON:  January 31, 2007 FINDINGS: There is airspace consolidation throughout the left mid lower lung zone. There is patchy airspace opacity in the right mid lower lung zones, less than on the left. Lungs elsewhere are clear. Heart size and pulmonary vascularity are normal. No adenopathy. No bone lesions. IMPRESSION: Multifocal pneumonia, most severe in the left base. Heart size and pulmonary vascularity are normal. No adenopathy. Followup PA and lateral chest radiographs recommended in 3-4 weeks following trial of antibiotic therapy to ensure resolution and exclude underlying malignancy. Note that in the right lower lung region, one view areas of consolidation does have a somewhat nodular appearance. Follow-up imaging strongly advised with particular attention to the right lower lobe on subsequent examination. Electronically Signed   By: Lowella Grip III M.D.   On: 12/16/2016 19:55   Dg Chest Port 1v Same Day  Result Date: 12/19/2016 CLINICAL DATA:  Intubated EXAM: PORTABLE CHEST 1 VIEW COMPARISON:  12/18/2016 FINDINGS: Endotracheal tube is unchanged, approximately 2 cm above the carina. NG tube enters the stomach. Severe diffuse bilateral airspace disease noted, increased since prior study. Cardiomegaly. Low lung  volumes. No definite effusions or acute bony abnormality. IMPRESSION: Low lung volumes. Severe diffuse bilateral airspace disease, slightly worsened. Electronically Signed   By: Rolm Baptise M.D.   On: 12/19/2016 11:26   Dg Abd Portable 1v  Result Date: 12/18/2016 CLINICAL DATA:  Orogastric tube placement.  Initial encounter. EXAM: PORTABLE ABDOMEN - 1 VIEW COMPARISON:  None. FINDINGS: The patient's enteric tube is noted ending overlying the body of the stomach. The visualized bowel gas pattern is grossly unremarkable. No intra-abdominal air is seen, though evaluation for free air is limited on a single supine view. Patchy bilateral airspace opacification remains concerning for pneumonia. IMPRESSION: Enteric tube noted ending overlying the body of the stomach. Electronically Signed   By: Garald Balding M.D.   On: 12/18/2016 19:06    Microbiology: Recent Results (from the past 240 hour(s))  Culture, respiratory (NON-Expectorated)     Status: None   Collection Time: 12/19/16  3:34 PM  Result Value Ref Range Status   Specimen Description TRACHEAL ASPIRATE  Final   Special Requests NONE  Final   Gram Stain   Final    ABUNDANT WBC PRESENT,BOTH PMN AND MONONUCLEAR NO ORGANISMS SEEN    Culture RARE CANDIDA ALBICANS  Final   Report Status 12/22/2016 FINAL  Final  Culture, respiratory (NON-Expectorated)     Status: None   Collection Time: 12/21/16 11:26 AM  Result Value Ref Range Status   Specimen Description TRACHEAL ASPIRATE  Final   Special Requests NONE  Final   Gram Stain  Final    ABUNDANT WBC PRESENT, PREDOMINANTLY PMN RARE GRAM POSITIVE COCCI IN PAIRS RARE YEAST    Culture Consistent with normal respiratory flora.  Final   Report Status 12/23/2016 FINAL  Final  Culture, blood (routine x 2)     Status: None   Collection Time: 12/21/16 11:40 AM  Result Value Ref Range Status   Specimen Description BLOOD RIGHT ANTECUBITAL  Final   Special Requests BOTTLES DRAWN AEROBIC ONLY [place]  Final    Culture NO GROWTH 5 DAYS  Final   Report Status 12/26/2016 FINAL  Final  Culture, blood (routine x 2)     Status: None   Collection Time: 12/21/16 11:48 AM  Result Value Ref Range Status   Specimen Description BLOOD RIGHT ARM  Final   Special Requests IN PEDIATRIC BOTTLE 3CC  Final   Culture NO GROWTH 5 DAYS  Final   Report Status 12/26/2016 FINAL  Final  Culture, Urine     Status: None   Collection Time: 12/21/16 12:45 PM  Result Value Ref Range Status   Specimen Description URINE, RANDOM  Final   Special Requests NONE  Final   Culture NO GROWTH  Final   Report Status 12/22/2016 FINAL  Final  Culture, respiratory (NON-Expectorated)     Status: None   Collection Time: 12/25/16 12:18 PM  Result Value Ref Range Status   Specimen Description TRACHEAL ASPIRATE  Final   Special Requests NONE  Final   Gram Stain   Final    FEW WBC PRESENT, PREDOMINANTLY PMN RARE GRAM NEGATIVE RODS RARE GRAM POSITIVE RODS    Culture Consistent with normal respiratory flora.  Final   Report Status 12/28/2016 FINAL  Final  Urine culture     Status: None   Collection Time: 12/25/16 12:38 PM  Result Value Ref Range Status   Specimen Description URINE, RANDOM  Final   Special Requests NONE  Final   Culture NO GROWTH  Final   Report Status 12/26/2016 FINAL  Final     Labs: Basic Metabolic Panel:  Recent Labs Lab 12/22/16 0402 12/23/16 [id number]  12/24/16 0220  12/25/16 0239  12/26/16 0234 12/26/16 [id number] 12/26/16 2015 12/27/16 [id number] 12/27/16 0813 12/27/16 2028 12/28/16 0820  NA 149* 150*  < > 150*  < > 148*  < >  --  142 141  --  141 137 141  K 3.4* 3.6  < > 3.5  < > 3.1*  < >  --  3.4* 3.6  --  3.5 3.5 3.3*  CL 116* 118*  < > 119*  < > 119*  < >  --  112* 109  --  111 103 109  CO2 26 26  < > 25  < > 22  < >  --  24 24  --  23 24 23   GLUCOSE 182* 162*  < > 157*  < > 157*  < >  --  180* 144*  --  125* 187* 130*  BUN 39* 34*  < > 35*  < > 35*  < >  --  19 19  --  19 21* 16  CREATININE 1.28*  1.02  < > 0.92  < > 0.78  < >  --  0.74 0.64  --  0.74 0.82 0.78  CALCIUM 8.4* 8.3*  < > 8.4*  < > 8.5*  < >  --  8.3* 8.4*  --  8.5* 8.3* 8.5*  MG 2.1 2.1  --   --   --   --   --   --   --   --   --   --   --   --  PHOS 3.0 2.9  --  2.7  --  2.5  --  2.8  --   --  2.5  --   --  2.7  < > = values in this interval not displayed. Liver Function Tests: No results for input(s): AST, ALT, ALKPHOS, BILITOT, PROT, ALBUMIN in the last 168 hours. No results for input(s): LIPASE, AMYLASE in the last 168 hours. No results for input(s): AMMONIA in the last 168 hours. CBC:  Recent Labs Lab 12/22/16 0402 12/23/16 0213 12/24/16 0220 12/25/16 0239 12/26/16 0234  WBC 14.3* 12.4* 10.6* 8.7 9.4  NEUTROABS  --  10.2*  --   --   --   HGB 9.2* 8.7* 8.6* 8.5* 8.8*  HCT 27.1* 26.4* 27.0* 25.9* 27.3*  MCV 82.1 83.5 84.6 83.8 84.8  PLT 214 259 355 424* 511*   Cardiac Enzymes: No results for input(s): CKTOTAL, CKMB, CKMBINDEX, TROPONINI in the last 168 hours. BNP: BNP (last 3 results) No results for input(s): BNP in the last 8760 hours.  ProBNP (last 3 results) No results for input(s): PROBNP in the last 8760 hours.  CBG:  Recent Labs Lab 12/27/16 1251 12/27/16 1656 12/27/16 2030 12/28/16 0743 12/28/16 1143  GLUCAP 115* 141* 164* 121* 177*    Signed:  Velvet Bathe MD.  [facility] 12/28/2016, 2:50 PM

## 2016-12-31 DIAGNOSIS — A419 Sepsis, unspecified organism: Secondary | ICD-10-CM | POA: Diagnosis not present

## 2016-12-31 DIAGNOSIS — J80 Acute respiratory distress syndrome: Secondary | ICD-10-CM | POA: Diagnosis not present

## 2017-01-10 DIAGNOSIS — J181 Lobar pneumonia, unspecified organism: Secondary | ICD-10-CM | POA: Diagnosis not present

## 2017-01-10 DIAGNOSIS — E7800 Pure hypercholesterolemia, unspecified: Secondary | ICD-10-CM | POA: Diagnosis not present

## 2017-01-10 DIAGNOSIS — E119 Type 2 diabetes mellitus without complications: Secondary | ICD-10-CM | POA: Diagnosis not present

## 2017-01-18 DIAGNOSIS — E119 Type 2 diabetes mellitus without complications: Secondary | ICD-10-CM | POA: Diagnosis not present

## 2017-01-18 DIAGNOSIS — E7800 Pure hypercholesterolemia, unspecified: Secondary | ICD-10-CM | POA: Diagnosis not present

## 2017-01-18 DIAGNOSIS — J181 Lobar pneumonia, unspecified organism: Secondary | ICD-10-CM | POA: Diagnosis not present

## 2017-01-22 DIAGNOSIS — E7800 Pure hypercholesterolemia, unspecified: Secondary | ICD-10-CM | POA: Diagnosis not present

## 2017-01-22 DIAGNOSIS — E119 Type 2 diabetes mellitus without complications: Secondary | ICD-10-CM | POA: Diagnosis not present

## 2017-01-22 DIAGNOSIS — J181 Lobar pneumonia, unspecified organism: Secondary | ICD-10-CM | POA: Diagnosis not present

## 2017-01-28 DIAGNOSIS — J9601 Acute respiratory failure with hypoxia: Secondary | ICD-10-CM | POA: Diagnosis not present

## 2017-01-28 DIAGNOSIS — J80 Acute respiratory distress syndrome: Secondary | ICD-10-CM | POA: Diagnosis not present

## 2017-01-28 DIAGNOSIS — J189 Pneumonia, unspecified organism: Secondary | ICD-10-CM | POA: Diagnosis not present

## 2017-01-29 DIAGNOSIS — J181 Lobar pneumonia, unspecified organism: Secondary | ICD-10-CM | POA: Diagnosis not present

## 2017-02-15 DIAGNOSIS — E7800 Pure hypercholesterolemia, unspecified: Secondary | ICD-10-CM | POA: Diagnosis not present

## 2017-02-15 DIAGNOSIS — E119 Type 2 diabetes mellitus without complications: Secondary | ICD-10-CM | POA: Diagnosis not present

## 2017-02-15 DIAGNOSIS — J181 Lobar pneumonia, unspecified organism: Secondary | ICD-10-CM | POA: Diagnosis not present

## 2017-02-15 DIAGNOSIS — M7989 Other specified soft tissue disorders: Secondary | ICD-10-CM | POA: Diagnosis not present

## 2017-02-25 DIAGNOSIS — J9601 Acute respiratory failure with hypoxia: Secondary | ICD-10-CM | POA: Diagnosis not present

## 2017-02-25 DIAGNOSIS — J80 Acute respiratory distress syndrome: Secondary | ICD-10-CM | POA: Diagnosis not present

## 2017-02-25 DIAGNOSIS — J189 Pneumonia, unspecified organism: Secondary | ICD-10-CM | POA: Diagnosis not present

## 2017-03-15 DIAGNOSIS — E7800 Pure hypercholesterolemia, unspecified: Secondary | ICD-10-CM | POA: Diagnosis not present

## 2017-03-15 DIAGNOSIS — E119 Type 2 diabetes mellitus without complications: Secondary | ICD-10-CM | POA: Diagnosis not present

## 2017-03-15 DIAGNOSIS — J181 Lobar pneumonia, unspecified organism: Secondary | ICD-10-CM | POA: Diagnosis not present

## 2017-07-05 DIAGNOSIS — E7800 Pure hypercholesterolemia, unspecified: Secondary | ICD-10-CM | POA: Diagnosis not present

## 2017-07-05 DIAGNOSIS — R5383 Other fatigue: Secondary | ICD-10-CM | POA: Diagnosis not present

## 2017-07-05 DIAGNOSIS — E119 Type 2 diabetes mellitus without complications: Secondary | ICD-10-CM | POA: Diagnosis not present

## 2017-10-14 DIAGNOSIS — E119 Type 2 diabetes mellitus without complications: Secondary | ICD-10-CM | POA: Diagnosis not present

## 2017-10-14 DIAGNOSIS — E1165 Type 2 diabetes mellitus with hyperglycemia: Secondary | ICD-10-CM | POA: Diagnosis not present

## 2017-10-14 DIAGNOSIS — Z23 Encounter for immunization: Secondary | ICD-10-CM | POA: Diagnosis not present

## 2017-10-14 DIAGNOSIS — E7800 Pure hypercholesterolemia, unspecified: Secondary | ICD-10-CM | POA: Diagnosis not present

## 2018-05-25 IMAGING — DX DG CHEST 1V PORT
1 series · 1 of 1 positions shown · non-contrast
Comparison: January 31, 2007

CLINICAL DATA: Shortness of breath and fever

EXAM:
PORTABLE CHEST 1 VIEW

[chest ap]
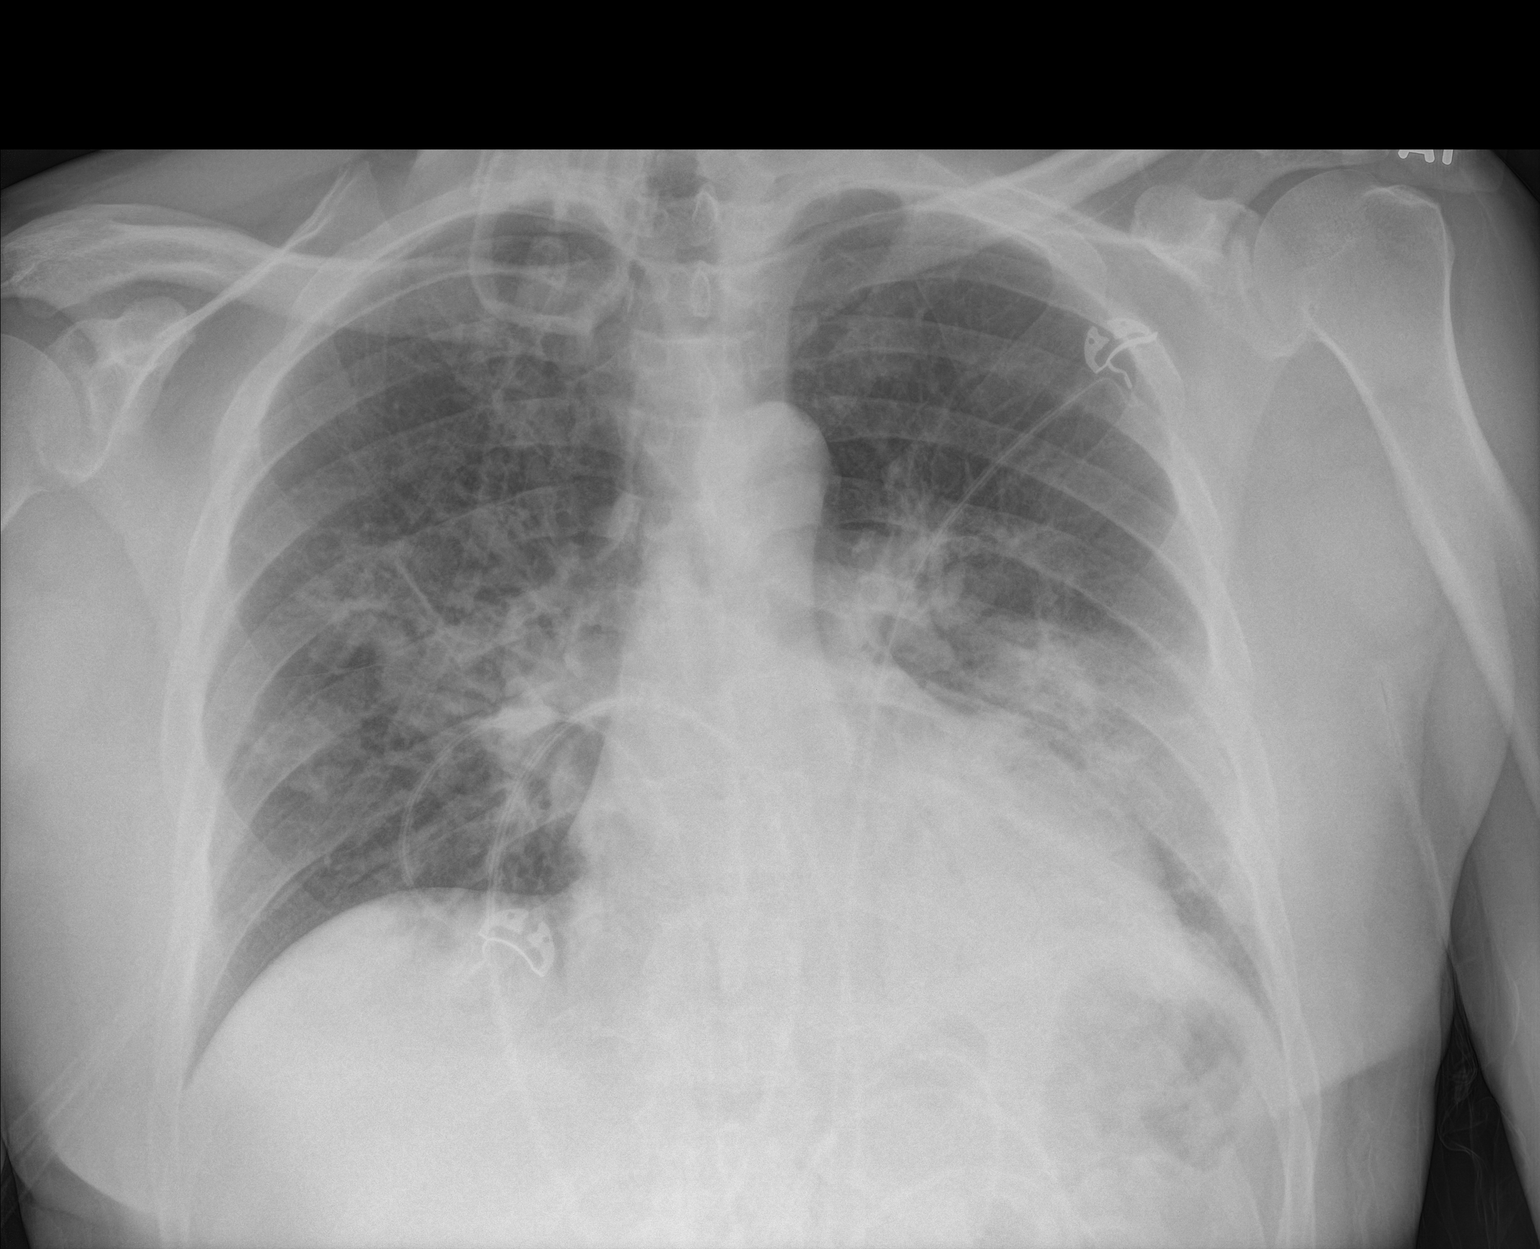

[1 of 1 positions shown; findings below may reference images not displayed]

FINDINGS: There is airspace consolidation throughout the left mid lower lung
zone. There is patchy airspace opacity in the right mid lower lung
zones, less than on the left. Lungs elsewhere are clear. Heart size
and pulmonary vascularity are normal. No adenopathy. No bone
lesions.
IMPRESSION: Multifocal pneumonia, most severe in the left base. Heart size and
pulmonary vascularity are normal. No adenopathy.

Followup PA and lateral chest radiographs recommended in 3-4 weeks
following trial of antibiotic therapy to ensure resolution and
exclude underlying malignancy. Note that in the right lower lung
region, one view areas of consolidation does have a somewhat nodular
appearance. Follow-up imaging strongly advised with particular
attention to the right lower lobe on subsequent examination.

## 2018-06-03 IMAGING — CT CT CHEST W/O CM
2 of 3 series · 15 of 36 positions shown, 18 images · non-contrast
Comparison: Chest x-ray 12/23/2016

CLINICAL DATA: Fever and sepsis.

EXAM:
CT CHEST WITHOUT CONTRAST
TECHNIQUE: Multidetector CT imaging of the chest was performed following the
standard protocol without IV contrast.

[Series 2: chest w/o 2mm st · axial · non-contrast · 0.74mm/px · z∈[-710,-474]mm · 12 of 140 slices shown, 15 images]
[im 11/140  mediastinal]
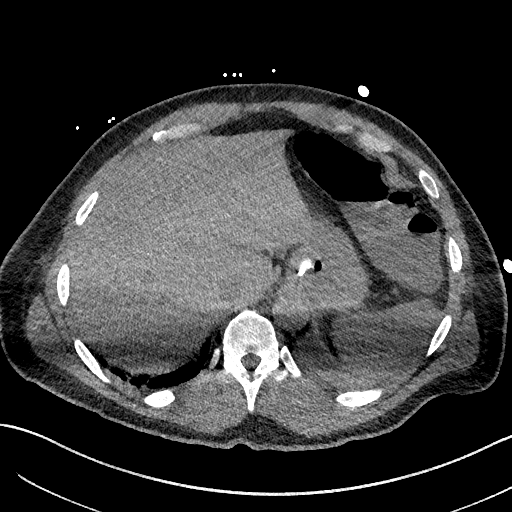
[im 11/140  lung]
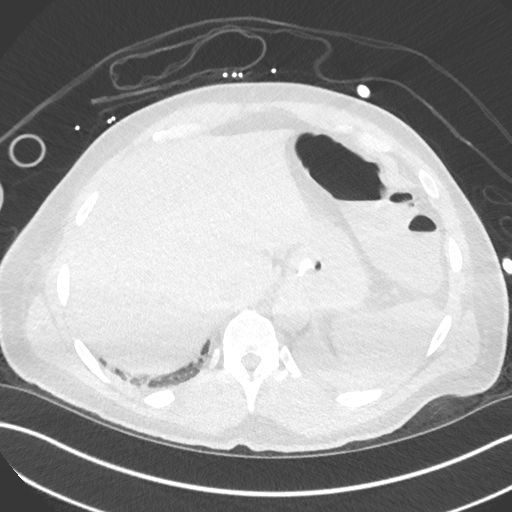
[im 21/140  lung]
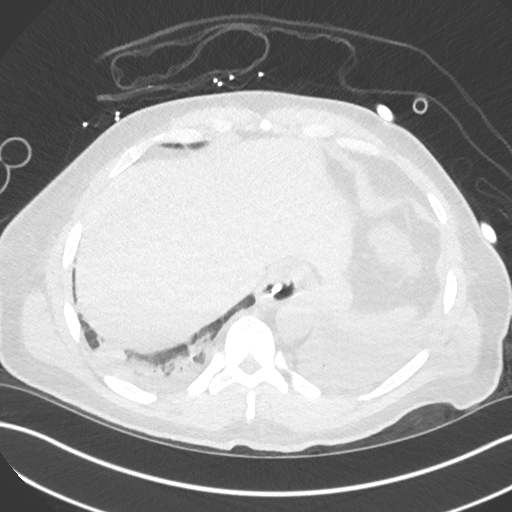
[im 31/140  lung]
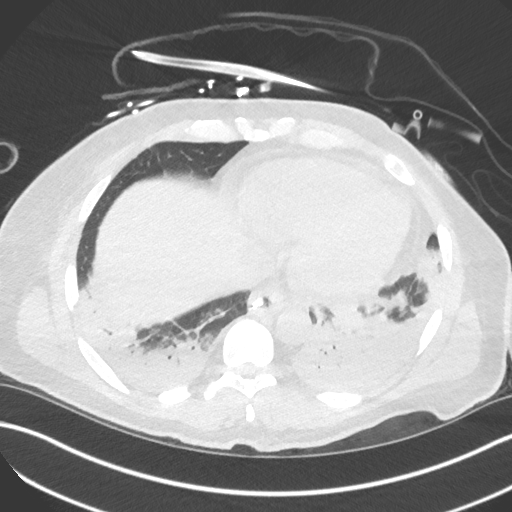
[im 42/140  lung]
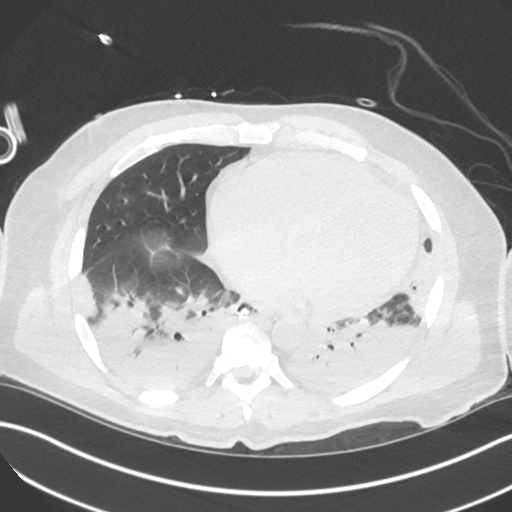
[im 52/140  mediastinal]
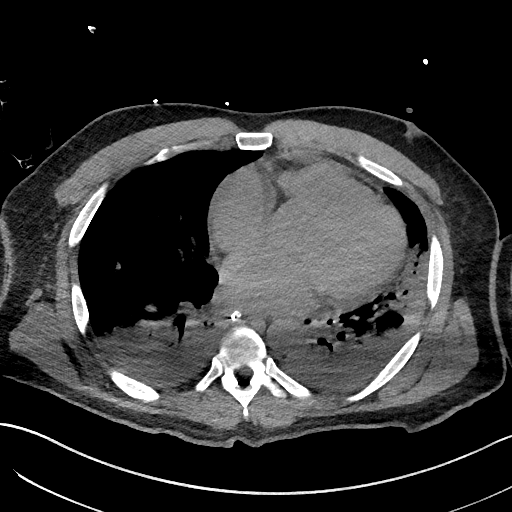
[im 52/140  lung]
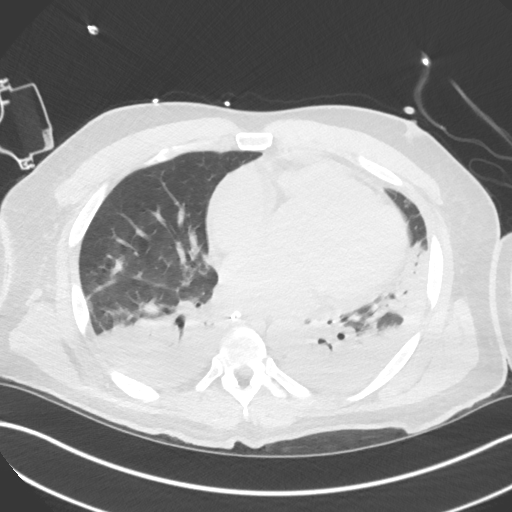
[im 62/140  lung]
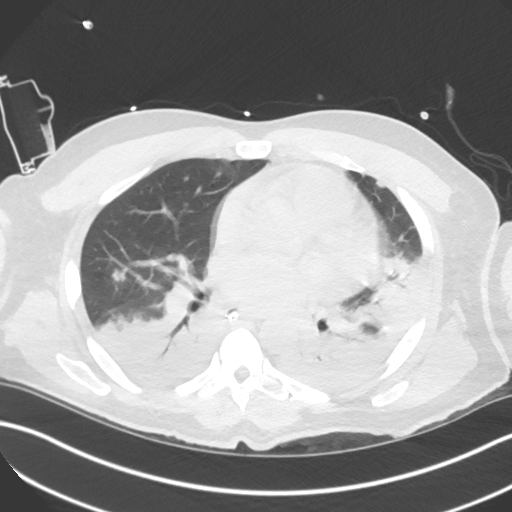
[im 78/140  lung]
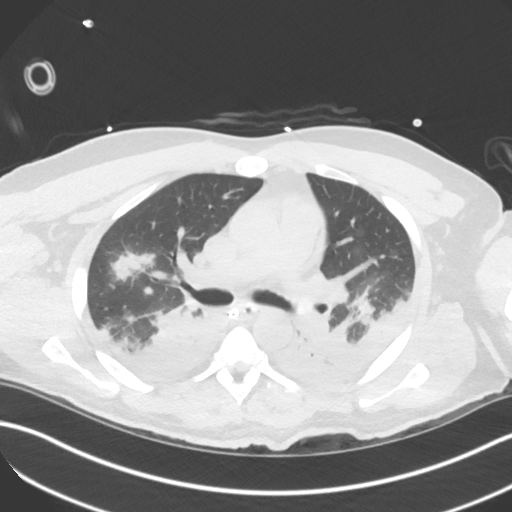
[im 88/140  lung]
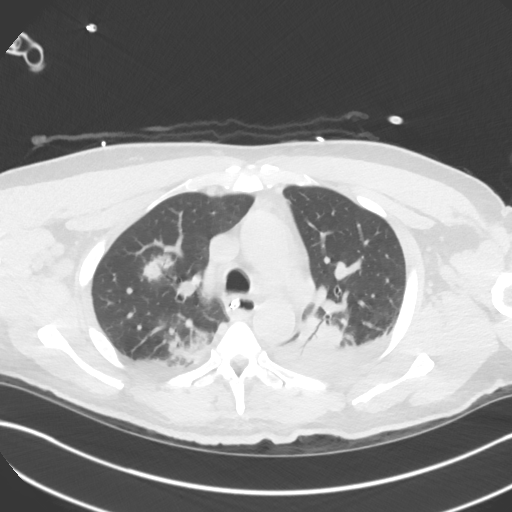
[im 98/140  mediastinal]
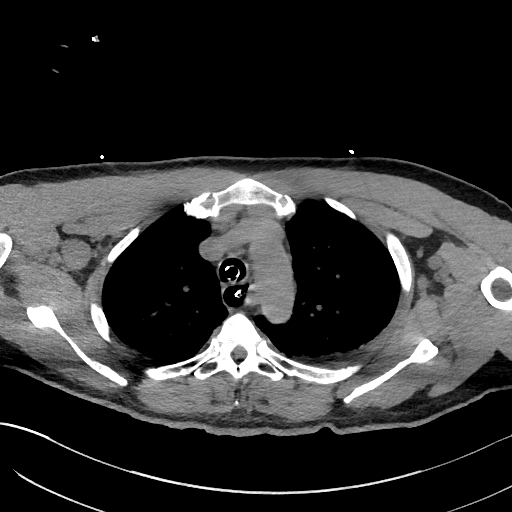
[im 98/140  lung]
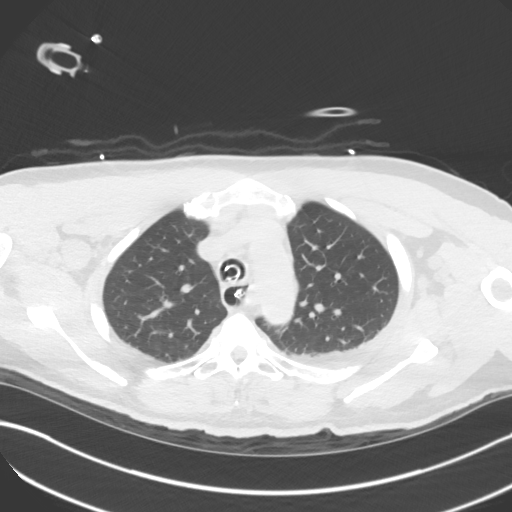
[im 109/140  lung]
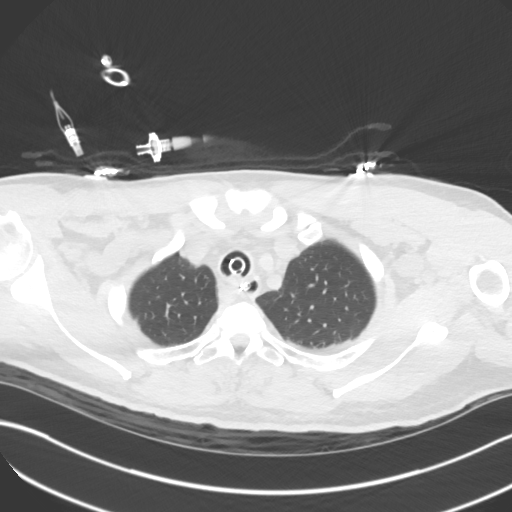
[im 119/140  lung]
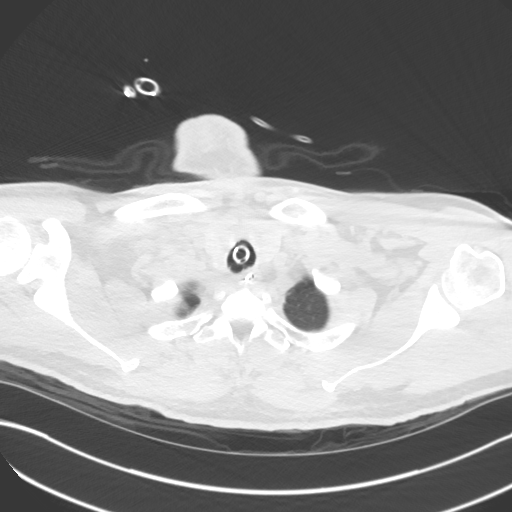
[im 129/140  lung]
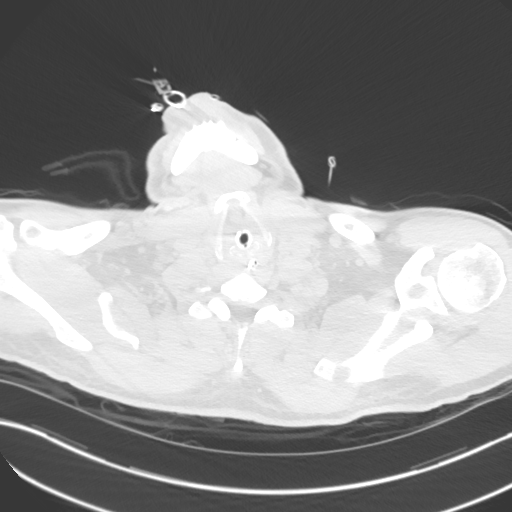

[Series 4: chest w/o 3mm st cor · coronal · non-contrast · 0.55mm/px · 3 of 101 slices shown]
[im 21/101  lung]
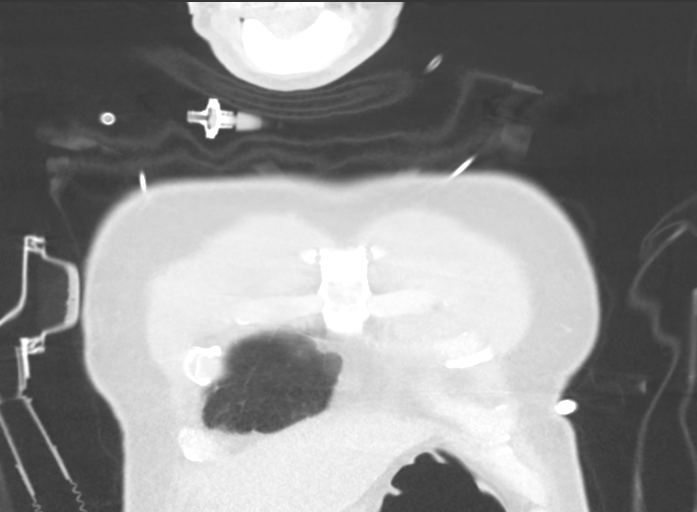
[im 41/101  lung]
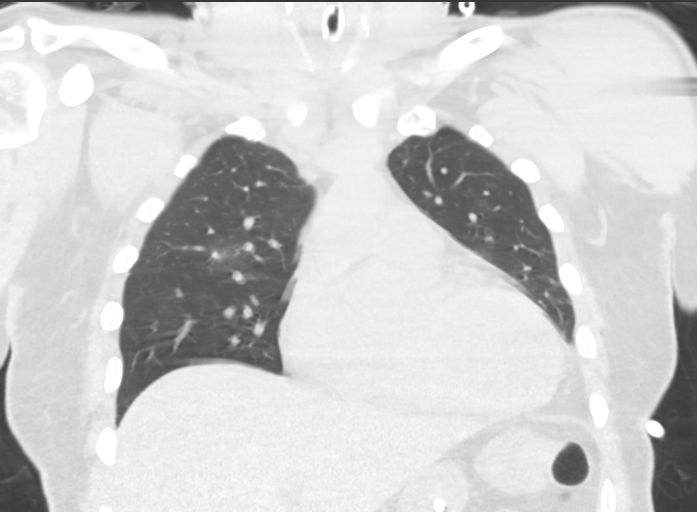
[im 61/101  lung]
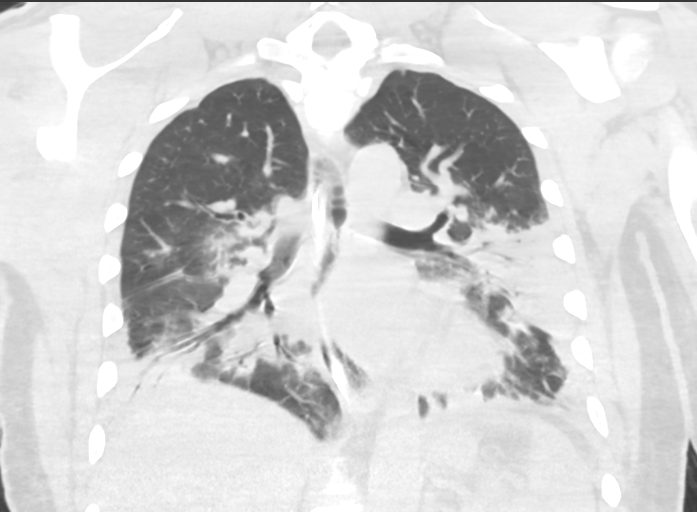

[15 of 36 positions shown; findings below may reference images not displayed]

FINDINGS: Cardiovascular: Heart is enlarged. No substantial pericardial
effusion. No thoracic aortic aneurysm.

Mediastinum/Nodes: Endotracheal tube noted with tip above the
carina. NG tube passes into the stomach although distal tip is not
visualized. No mediastinal lymphadenopathy. No evidence for gross
hilar lymphadenopathy although assessment is limited by the lack of
intravenous contrast on today's study. There is no axillary
lymphadenopathy.

Lungs/Pleura: Dense bilateral lower lobe collapse/ consolidation is
associated with central patchy airspace disease in the right upper
and middle lobes. There is some confluent airspace consolidation in
the lingula as well. No substantial pleural effusion.

Upper Abdomen: Unremarkable.

Musculoskeletal: Bone windows reveal no worrisome lytic or sclerotic
osseous lesions.
IMPRESSION: 1. Dense bilateral lower lobe airspace consolidation with patchy
airspace disease in the right middle lobe and lingula. Imaging
features compatible with multifocal pneumonia.

## 2018-06-03 IMAGING — CR DG CHEST 1V PORT
1 series · 1 of 1 positions shown · non-contrast
Comparison: CT 12/25/2016.  Chest x-ray 12/23/2016 .

CLINICAL DATA: Intubation.

EXAM:
PORTABLE CHEST 1 VIEW

[portable]
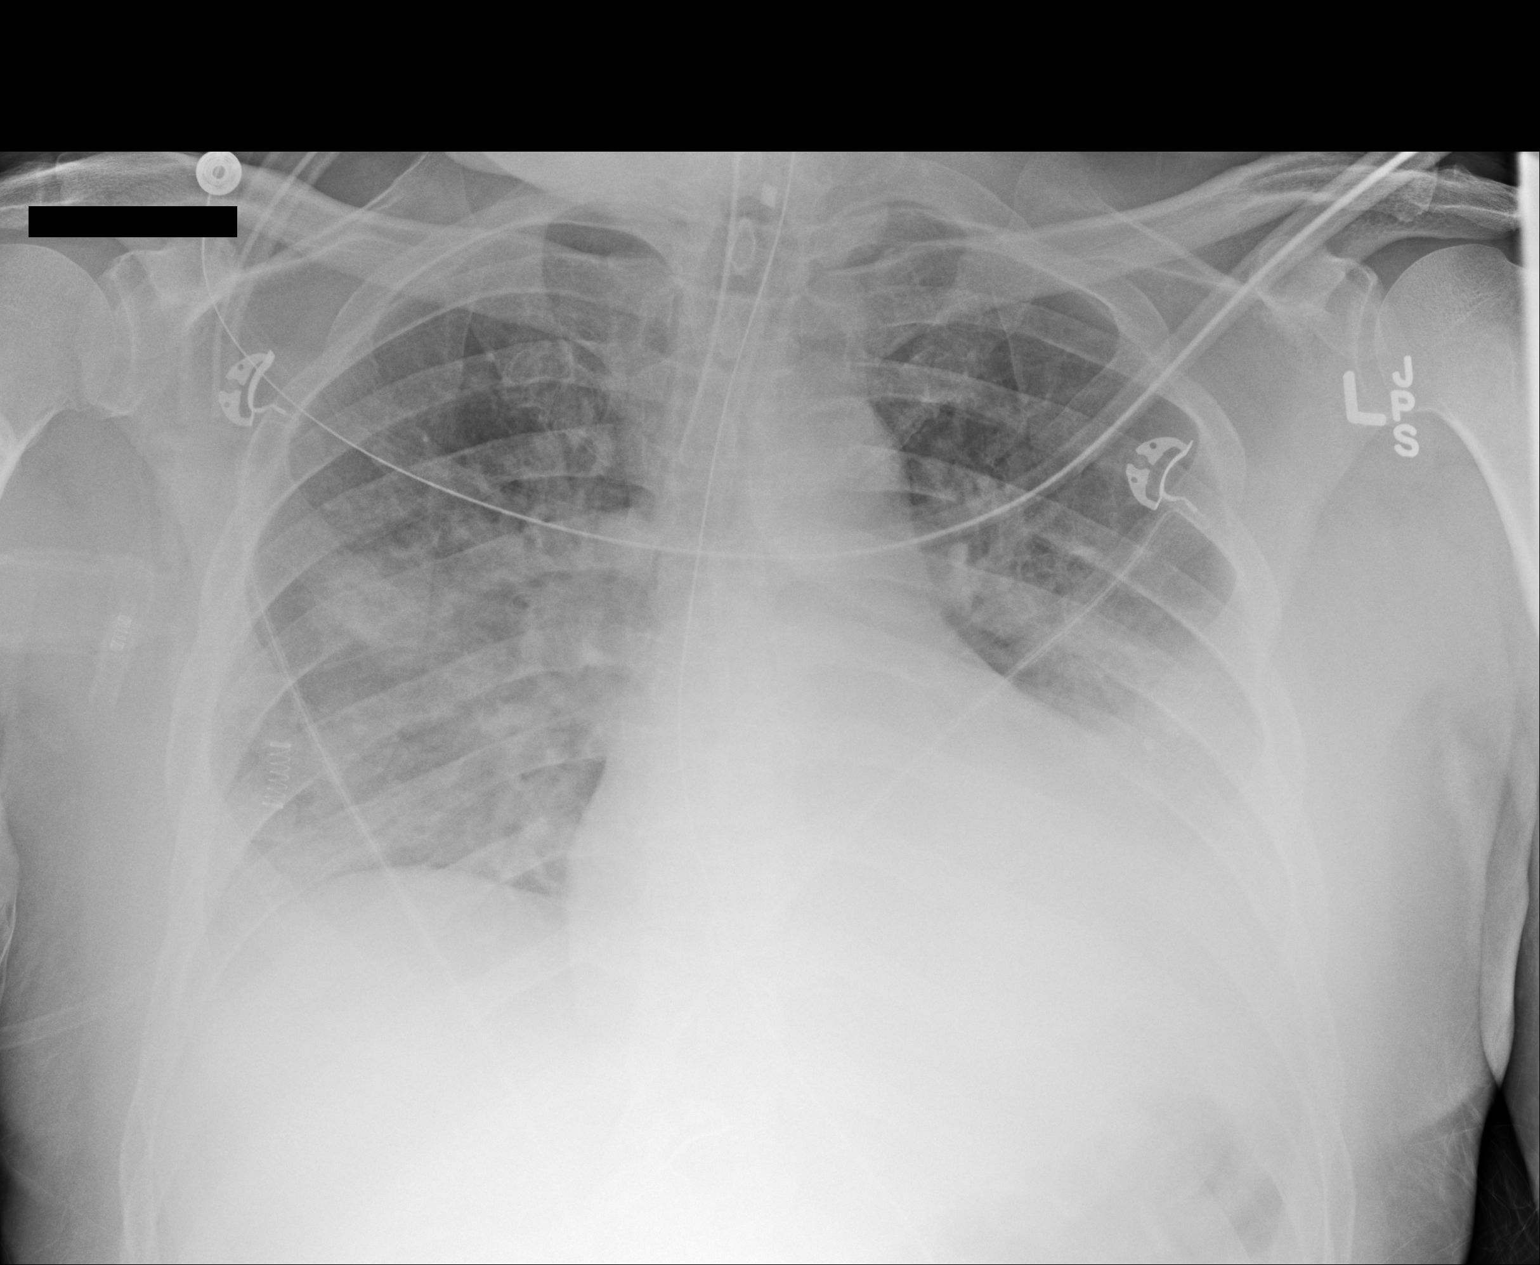

[1 of 1 positions shown; findings below may reference images not displayed]

FINDINGS: Endotracheal tube and NG tube noted in stable position. Stable
cardiomegaly. Progressive bilateral pulmonary infiltrates are noted
consistent with progressive multifocal pneumonia and/or pulmonary
edema. Bilateral pleural effusion noted on today's exam. No
pneumothorax.
IMPRESSION: 1. Lines and tubes in stable position.

2. Stable cardiomegaly. Progressive bilateral pulmonary infiltrates
consistent with progressive multifocal pneumonia and/or pulmonary
edema. Bilateral pleural effusions noted on today's exam .

## 2018-08-15 DIAGNOSIS — E119 Type 2 diabetes mellitus without complications: Secondary | ICD-10-CM | POA: Diagnosis not present

## 2018-08-15 DIAGNOSIS — Z125 Encounter for screening for malignant neoplasm of prostate: Secondary | ICD-10-CM | POA: Diagnosis not present

## 2018-08-15 DIAGNOSIS — Z0000 Encounter for general adult medical examination without abnormal findings: Secondary | ICD-10-CM | POA: Diagnosis not present

## 2018-08-15 DIAGNOSIS — Z1211 Encounter for screening for malignant neoplasm of colon: Secondary | ICD-10-CM | POA: Diagnosis not present

## 2018-08-15 DIAGNOSIS — Z23 Encounter for immunization: Secondary | ICD-10-CM | POA: Diagnosis not present

## 2018-08-15 DIAGNOSIS — E7800 Pure hypercholesterolemia, unspecified: Secondary | ICD-10-CM | POA: Diagnosis not present

## 2018-12-09 DIAGNOSIS — I10 Essential (primary) hypertension: Secondary | ICD-10-CM | POA: Diagnosis not present

## 2018-12-09 DIAGNOSIS — E7800 Pure hypercholesterolemia, unspecified: Secondary | ICD-10-CM | POA: Diagnosis not present

## 2018-12-09 DIAGNOSIS — E119 Type 2 diabetes mellitus without complications: Secondary | ICD-10-CM | POA: Diagnosis not present

## 2018-12-09 DIAGNOSIS — N529 Male erectile dysfunction, unspecified: Secondary | ICD-10-CM | POA: Diagnosis not present

## 2019-01-09 DIAGNOSIS — I10 Essential (primary) hypertension: Secondary | ICD-10-CM | POA: Diagnosis not present

## 2019-02-21 DIAGNOSIS — E119 Type 2 diabetes mellitus without complications: Secondary | ICD-10-CM | POA: Diagnosis not present

## 2019-07-13 DIAGNOSIS — H60311 Diffuse otitis externa, right ear: Secondary | ICD-10-CM | POA: Insufficient documentation

## 2019-07-13 DIAGNOSIS — H6123 Impacted cerumen, bilateral: Secondary | ICD-10-CM | POA: Insufficient documentation

## 2020-03-19 ENCOUNTER — Ambulatory Visit: Payer: 59 | Attending: Internal Medicine

## 2020-03-19 DIAGNOSIS — Z23 Encounter for immunization: Secondary | ICD-10-CM

## 2020-03-19 NOTE — Progress Notes (Signed)
   Covid-19 Vaccination Clinic  Name:  Neil Murphy    MRN: [id number] DOB: [birth year]  03/19/2020  [name] was observed post Covid-19 immunization for 15 minutes without incident. He was provided with Vaccine Information Sheet and instruction to access the V-Safe system.   [name] was instructed to call 911 with any severe reactions post vaccine: Marland Kitchen Difficulty breathing  . Swelling of face and throat  . A fast heartbeat  . A bad rash all over body  . Dizziness and weakness   Immunizations Administered    Name Date Dose VIS Date Route   Pfizer COVID-19 Vaccine 03/19/2020  9:27 AM 0.3 mL 11/20/2019 Intramuscular   Manufacturer: [place]   Lot: [id number]   [place]: [id number]

## 2020-04-13 ENCOUNTER — Ambulatory Visit: Payer: 59 | Attending: Internal Medicine

## 2020-04-13 DIAGNOSIS — Z23 Encounter for immunization: Secondary | ICD-10-CM

## 2023-05-30 ENCOUNTER — Other Ambulatory Visit: Payer: Self-pay

## 2023-05-30 ENCOUNTER — Ambulatory Visit
Admission: RE | Admit: 2023-05-30 | Discharge: 2023-05-30 | Disposition: A | Discharge status: Home / Self Care | Payer: 59 | Source: Ambulatory Visit | Attending: Family Medicine | Admitting: Family Medicine

## 2023-05-30 VITALS — BP 154/78 | HR 69 | Temp 98.3°F | Resp 16 | Ht 68.5 in | Wt 191.0 lb

## 2023-05-30 DIAGNOSIS — T1592XA Foreign body on external eye, part unspecified, left eye, initial encounter: Secondary | ICD-10-CM | POA: Diagnosis not present

## 2023-05-30 DIAGNOSIS — H18822 Corneal disorder due to contact lens, left eye: Secondary | ICD-10-CM

## 2023-05-30 MED ORDER — TETRACAINE HCL 0.5 % OP SOLN: 2.0000 [drp] | Freq: Once | OPHTHALMIC | Status: DC

## 2023-05-30 MED ORDER — CIPROFLOXACIN HCL 0.3 % OP SOLN: 2.0000 [drp] | Freq: Three times a day (TID) | OPHTHALMIC | 0 refills | Status: AC

## 2023-05-30 NOTE — ED Provider Notes (Signed)
[facility]    CSN: [id number] Arrival date & time: 05/30/23  1745      History   Chief Complaint Chief Complaint  Patient presents with   Eye Problem    possible contact stuck in eye - Entered by patient    HPI Neil Murphy is a 62 y.o. male.   HPI Patient presents for evaluation foreign body in left eye.  Patient reports that he attempted to remove a disposable contact lens from his eye earlier this morning and continues to feel the sensation that the contact lens is affixed to his left orbit.  He reports pain under the upper left eyelid.  He feels with blinking.  Denies any discomfort in the lower eyelid although endorses irritation for several attempts to try and remove the contact lens.  The contact lenses has been affixed to his orbit since this morning. Past Medical History:  Diagnosis Date   GERD (gastroesophageal reflux disease)    OCCASIONALLY   Phimosis    Type 2 diabetes mellitus (HCC)     Patient Active Problem List   Diagnosis Date Noted   Sepsis (HCC)    ARDS (adult respiratory distress syndrome) (HCC)    Acute respiratory failure with hypoxemia (HCC)    Flu    Hypoxia    Encounter for intubation    Community acquired pneumonia 12/16/2016    Past Surgical History:  Procedure Laterality Date   CIRCUMCISION N/A 04/07/2013   Procedure: CIRCUMCISION ;  Surgeon: Lindaann Slough, MD;  Location: [facility];  Service: Urology;  Laterality: N/A;       Home Medications    Prior to Admission medications   Medication Sig Start Date End Date Taking? Authorizing Provider  ciprofloxacin (CILOXAN) 0.3 % ophthalmic solution Place 2 drops into the left eye in the morning, at noon, and at bedtime for 7 days. 05/30/23 06/06/23 Yes Bing Neighbors, NP  metFORMIN (GLUCOPHAGE) 1000 MG tablet Take 1,000 mg by mouth 2 (two) times daily. 12/09/16  Yes [provider]  feeding supplement, GLUCERNA SHAKE, (GLUCERNA SHAKE) LIQD Take  237 mLs by mouth 2 (two) times daily between meals. 12/29/16   Penny Pia, MD    Family History History reviewed. No pertinent family history.  Social History Social History   Tobacco Use   Smoking status: Never   Smokeless tobacco: Never  Vaping Use   Vaping Use: Never used  Substance Use Topics   Alcohol use: No   Drug use: No     Allergies   Patient has no known allergies.   Review of Systems Review of Systems Pertinent negatives listed in HPI   Physical Exam Triage Vital Signs ED Triage Vitals  Enc Vitals Group     BP 05/30/23 1758 (!) 154/78     Pulse Rate 05/30/23 1758 69     Resp 05/30/23 1758 16     Temp 05/30/23 1758 98.3 F (36.8 C)     Temp Source 05/30/23 1758 Oral     SpO2 05/30/23 1758 97 %     Weight 05/30/23 1759 191 lb (86.6 kg)     Height 05/30/23 1759 5' 8.5" (1.74 m)     Head Circumference --      Peak Flow --      Pain Score 05/30/23 1759 0     Pain Loc --      Pain Edu? --      Excl. in GC? --    No data  found.  Updated Vital Signs BP (!) 154/78 (BP Location: Left Arm)   Pulse 69   Temp 98.3 F (36.8 C) (Oral)   Resp 16   Ht 5' 8.5" (1.74 m)   Wt 191 lb (86.6 kg)   SpO2 97%   BMI 28.62 kg/m   Visual Acuity Right Eye Distance:   Left Eye Distance:   Bilateral Distance:    Right Eye Near:   Left Eye Near:    Bilateral Near:     Physical Exam Vitals reviewed.  Constitutional:      Appearance: Normal appearance.  HENT:     Head: Normocephalic.  Eyes:     General:        Left eye: Foreign body and discharge present.    Conjunctiva/sclera:     Left eye: Left conjunctiva is injected. Hemorrhage present.     Pupils:     Left eye: Corneal abrasion and fluorescein uptake present.     Comments: To drop of tetracaine used to stabilize left eye for fluorescein staining  Cardiovascular:     Rate and Rhythm: Normal rate and regular rhythm.  Pulmonary:     Effort: Pulmonary effort is normal.     Breath sounds: Normal  breath sounds.  Musculoskeletal:        General: Normal range of motion.  Skin:    General: Skin is warm.  Neurological:     General: No focal deficit present.     Mental Status: He is alert.      UC Treatments / Results  Labs (all labs ordered are listed, but only abnormal results are displayed) Labs Reviewed - No data to display  EKG   Radiology No results found.  Procedures Procedures (including critical care time)  Medications Ordered in UC Medications  tetracaine (PONTOCAINE) 0.5 % ophthalmic solution 2 drop (has no administration in time range)    Initial Impression / Assessment and Plan / UC Course  I have reviewed the triage vital signs and the nursing notes.  Pertinent labs & imaging results that were available during my care of the patient were reviewed by me and considered in my medical decision making (see chart for details).   FB in left eye, contact lens removed.  Patient sustained a corneal abrasion.  Start ciprofloxacin 3 times daily x 7 days.  Patient given information to follow-up with ophthalmologist his symptoms worsen or if he experiences any visual changes Final Clinical Impressions(s) / UC Diagnoses   Final diagnoses:  Foreign body, eye, left, initial encounter  Corneal abrasion due to contact lens, left   Discharge Instructions   None    ED Prescriptions     Medication Sig Dispense Auth. Provider   ciprofloxacin (CILOXAN) 0.3 % ophthalmic solution Place 2 drops into the left eye in the morning, at noon, and at bedtime for 7 days. 2.1 mL Bing Neighbors, NP      PDMP not reviewed this encounter.   Bing Neighbors, NP 05/31/23 1202

## 2023-05-30 NOTE — ED Triage Notes (Signed)
Patient believes he has a contact stuck in his left eye.  He noticed it this morning.

## 2023-12-20 ENCOUNTER — Encounter: Payer: Self-pay | Admitting: *Deleted

## 2023-12-20 ENCOUNTER — Ambulatory Visit
Admission: EM | Admit: 2023-12-20 | Discharge: 2023-12-20 | Disposition: A | Discharge status: Home / Self Care | Payer: 59

## 2023-12-20 ENCOUNTER — Other Ambulatory Visit: Payer: Self-pay

## 2023-12-20 DIAGNOSIS — R509 Fever, unspecified: Secondary | ICD-10-CM | POA: Diagnosis not present

## 2023-12-20 DIAGNOSIS — J101 Influenza due to other identified influenza virus with other respiratory manifestations: Secondary | ICD-10-CM

## 2023-12-20 LAB — POCT INFLUENZA A/B
Influenza A, POC: POSITIVE — AB
Influenza B, POC: NEGATIVE

## 2023-12-20 MED ORDER — IBUPROFEN 400 MG PO TABS
400.0000 mg | ORAL_TABLET | Freq: Once | ORAL | Status: AC
  Administered 2023-12-20: 400 mg via ORAL

## 2023-12-20 MED ORDER — OSELTAMIVIR PHOSPHATE 75 MG PO CAPS: 75.0000 mg | ORAL_CAPSULE | Freq: Two times a day (BID) | ORAL | 0 refills | Status: AC

## 2023-12-20 MED ORDER — ACETAMINOPHEN 325 MG PO TABS
975.0000 mg | ORAL_TABLET | Freq: Once | ORAL | Status: AC
  Administered 2023-12-20: 975 mg via ORAL

## 2023-12-20 MED ORDER — PROMETHAZINE-DM 6.25-15 MG/5ML PO SYRP: 5.0000 mL | ORAL_SOLUTION | Freq: Three times a day (TID) | ORAL | 0 refills | Status: AC | PRN

## 2023-12-20 NOTE — ED Triage Notes (Signed)
 Cough x 3-4 days. Denies fever. Denies other Sx. States "every now and then I will cough up some clear"

## 2023-12-20 NOTE — ED Provider Notes (Signed)
 [facility]    CSN: [id number] Arrival date & time: 12/20/23  1424      History   Chief Complaint Chief Complaint  Patient presents with   Cough    HPI Neil Murphy is a 63 y.o. male.   Patient presents today with a 2 to 3-day history of URI symptoms including cough, congestion, fever, body aches, headache.  Denies any chest pain, shortness of breath, nausea, vomiting.  Denies any known sick contacts.  He has been taking TheraFlu and Robitussin without improvement of symptoms.  He denies history of asthma or COPD but has had acute respiratory failure related to pneumonia several years ago.  He has had COVID several years ago.  He has not had COVID or influenza vaccinations.  He does have a history of diabetes but reports his blood sugars are well-controlled.  Denies any recent antibiotics or steroids.    Past Medical History:  Diagnosis Date   GERD (gastroesophageal reflux disease)    OCCASIONALLY   Phimosis    Type 2 diabetes mellitus (HCC)     Patient Active Problem List   Diagnosis Date Noted   Acute diffuse otitis externa of right ear 07/13/2019   Impacted cerumen of both ears 07/13/2019   Sepsis (HCC)    ARDS (adult respiratory distress syndrome) (HCC)    Acute respiratory failure with hypoxemia (HCC)    Flu    Hypoxia    Encounter for intubation    Community acquired pneumonia 12/16/2016    Past Surgical History:  Procedure Laterality Date   CIRCUMCISION N/A 04/07/2013   Procedure: CIRCUMCISION ;  Surgeon: Thomasine Oiler, MD;  Location: [facility];  Service: Urology;  Laterality: N/A;       Home Medications    Prior to Admission medications   Medication Sig Start Date End Date Taking? Authorizing Provider  metFORMIN (GLUCOPHAGE) 1000 MG tablet Take 1,000 mg by mouth 2 (two) times daily. 12/09/16  Yes [provider]  oseltamivir  (TAMIFLU ) 75 MG capsule Take 1 capsule (75 mg total) by mouth every 12 (twelve)  hours. 12/20/23  Yes Pasqualina Colasurdo K, PA-C  promethazine -dextromethorphan (PROMETHAZINE -DM) 6.25-15 MG/5ML syrup Take 5 mLs by mouth 3 (three) times daily as needed for cough. 12/20/23  Yes Shina Wass K, PA-C  feeding supplement, GLUCERNA SHAKE, (GLUCERNA SHAKE) LIQD Take 237 mLs by mouth 2 (two) times daily between meals. 12/29/16   Guido Hire, MD  metFORMIN (GLUCOPHAGE) 1000 MG tablet Take 1,000 mg by mouth daily with breakfast.    [provider]    Family History History reviewed. No pertinent family history.  Social History Social History   Tobacco Use   Smoking status: Never   Smokeless tobacco: Never  Vaping Use   Vaping status: Never Used  Substance Use Topics   Alcohol use: No   Drug use: No     Allergies   Patient has no known allergies.   Review of Systems Review of Systems  Constitutional:  Positive for activity change and fever. Negative for appetite change and fatigue.  HENT:  Positive for congestion. Negative for sinus pressure, sneezing and sore throat.   Respiratory:  Positive for cough. Negative for shortness of breath.   Cardiovascular:  Negative for chest pain.  Gastrointestinal:  Negative for abdominal pain, diarrhea, nausea and vomiting.  Musculoskeletal:  Positive for arthralgias and myalgias.  Neurological:  Positive for headaches. Negative for dizziness and light-headedness.     Physical Exam Triage Vital Signs ED  Triage Vitals  Encounter Vitals Group     BP 12/20/23 1650 114/73     Systolic BP Percentile --      Diastolic BP Percentile --      Pulse Rate 12/20/23 1650 (!) 121     Resp 12/20/23 1650 20     Temp 12/20/23 1650 (!) 102.8 F (39.3 C)     Temp Source 12/20/23 1650 Oral     SpO2 12/20/23 1650 (!) 89 %     Weight --      Height --      Head Circumference --      Peak Flow --      Pain Score 12/20/23 1649 0     Pain Loc --      Pain Education --      Exclude from Growth Chart --    No data found.  Updated Vital  Signs BP 114/73 (BP Location: Left Arm)   Pulse (!) 110   Temp (!) 100.7 F (38.2 C) (Oral)   Resp 20   SpO2 96%   Visual Acuity Right Eye Distance:   Left Eye Distance:   Bilateral Distance:    Right Eye Near:   Left Eye Near:    Bilateral Near:     Physical Exam Vitals reviewed.  Constitutional:      General: He is awake.     Appearance: Normal appearance. He is well-developed. He is not ill-appearing.     Comments: Very pleasant male appears stated age in no acute distress sitting comfortably in exam room  HENT:     Head: Normocephalic and atraumatic.     Right Ear: Tympanic membrane, ear canal and external ear normal. Tympanic membrane is not erythematous or bulging.     Left Ear: Tympanic membrane, ear canal and external ear normal. Tympanic membrane is not erythematous or bulging.     Nose: Nose normal.     Mouth/Throat:     Pharynx: Uvula midline. No oropharyngeal exudate, posterior oropharyngeal erythema or uvula swelling.  Cardiovascular:     Rate and Rhythm: Normal rate and regular rhythm.     Heart sounds: Normal heart sounds, S1 normal and S2 normal. No murmur heard. Pulmonary:     Effort: Pulmonary effort is normal. No accessory muscle usage or respiratory distress.     Breath sounds: Normal breath sounds. No stridor. No wheezing, rhonchi or rales.     Comments: Clear to auscultation bilaterally Neurological:     Mental Status: He is alert.  Psychiatric:        Behavior: Behavior is cooperative.      UC Treatments / Results  Labs (all labs ordered are listed, but only abnormal results are displayed) Labs Reviewed  POCT INFLUENZA A/B - Abnormal; Notable for the following components:      Result Value   Influenza A, POC Positive (*)    All other components within normal limits    EKG   Radiology No results found.  Procedures Procedures (including critical care time)  Medications Ordered in UC Medications  acetaminophen  (TYLENOL ) tablet 975  mg (975 mg Oral Given 12/20/23 1708)  ibuprofen  (ADVIL ) tablet 400 mg (400 mg Oral Given 12/20/23 1806)    Initial Impression / Assessment and Plan / UC Course  I have reviewed the triage vital signs and the nursing notes.  Pertinent labs & imaging results that were available during my care of the patient were reviewed by me and considered in my medical decision  making (see chart for details).     Patient was febrile and tachycardic this improved but did not normalize following antipyretics in clinic he is otherwise well-appearing, and nontoxic.  No evidence of acute infection on physical exam that would warrant initiation of antibiotics.  Recommended that he monitor his heart rate at home and if this is persistently above 100 after his fever has normalized he should be seen again.  He tested positive for influenza A.  Given his age and history of diabetes we will start Tamiflu  even though he has been symptomatic for 72 hours.  He was given Promethazine  DM for cough.  We discussed that this can be sedating and he is not to drive drink alcohol while taking it.  He is to use over-the-counter medication including Tylenol , ibuprofen , Mucinex, Flonase for additional symptom relief.  Recommended that he rest and drink plenty of fluid.  If his symptoms are not improving within a week he is to return for reevaluation.  Discussed that if anything worsens or changes and he has worsening cough, fever not responding to antipyretics, chest pain, shortness of breath, weakness he needs to be seen immediately.  Strict return precautions given.  Excuse note provided.  Final Clinical Impressions(s) / UC Diagnoses   Final diagnoses:  Influenza A  Fever, unspecified     Discharge Instructions      You tested positive for the flu.  Alternate Tylenol  ibuprofen  for pain and fever.  Start Promethazine  DM for cough.  This will make you sleepy so do not drive or drink alcohol while taking it.  Start Tamiflu  twice daily.   This should help decrease the risk of complication or hospitalization.  Make sure that you are resting and drinking plenty of fluid.  If your symptoms are not improving within a week you should return for reevaluation.  If anything worsens and you have persistent high fever not responding to medication, chest pain, worsening cough, weakness, nausea/vomiting interfering with oral intake you need to be seen immediately.     ED Prescriptions     Medication Sig Dispense Auth. Provider   oseltamivir  (TAMIFLU ) 75 MG capsule Take 1 capsule (75 mg total) by mouth every 12 (twelve) hours. 10 capsule Roshon Duell K, PA-C   promethazine -dextromethorphan (PROMETHAZINE -DM) 6.25-15 MG/5ML syrup Take 5 mLs by mouth 3 (three) times daily as needed for cough. 118 mL Kayler Rise K, PA-C      PDMP not reviewed this encounter.   Sherrell Rocky POUR, PA-C 12/20/23 1828

## 2023-12-20 NOTE — Discharge Instructions (Addendum)
 You tested positive for the flu.  Alternate Tylenol  ibuprofen  for pain and fever.  Start Promethazine  DM for cough.  This will make you sleepy so do not drive or drink alcohol while taking it.  Start Tamiflu  twice daily.  This should help decrease the risk of complication or hospitalization.  Make sure that you are resting and drinking plenty of fluid.  If your symptoms are not improving within a week you should return for reevaluation.  If anything worsens and you have persistent high fever not responding to medication, chest pain, worsening cough, weakness, nausea/vomiting interfering with oral intake you need to be seen immediately.

## 2025-01-02 ENCOUNTER — Ambulatory Visit: Admission: EM | Admit: 2025-01-02 | Discharge: 2025-01-02 | Disposition: A | Discharge status: Home / Self Care

## 2025-01-02 DIAGNOSIS — J069 Acute upper respiratory infection, unspecified: Secondary | ICD-10-CM

## 2025-01-02 NOTE — ED Notes (Signed)
Called for triage pt not in lobby. 

## 2025-01-02 NOTE — ED Triage Notes (Addendum)
 Neil Murphy presents with congestion, runny nose, sneezing, headache that comes and goes x day 3. States he does not have symptoms while working only when he come outside. Treated with Dayquil last night with some relief.

## 2025-01-02 NOTE — Discharge Instructions (Signed)

## 2025-01-02 NOTE — ED Provider Notes (Signed)
 " [facility]    CSN: [id number] Arrival date & time: 01/02/25  0850      History   Chief Complaint No chief complaint on file.   HPI Neil Murphy is a 64 y.o. male.   Pt presents today due to 3 days worth of nasal congestion, nasal drainage, sneezing, and cough. Pt denies fever or change in appetite. Pt states that he started OTC cold meds last night with relief of symptoms. Pt advised that we have no covid tests and he does not qualify for flu test due to being afebrile and having mild symptoms. Pt encouraged to get an OTC covid/flu test if he or his wife want to rule those viruses out.   The history is provided by the patient.    Past Medical History:  Diagnosis Date   GERD (gastroesophageal reflux disease)    OCCASIONALLY   Phimosis    Type 2 diabetes mellitus (HCC)     Patient Active Problem List   Diagnosis Date Noted   Acute diffuse otitis externa of right ear 07/13/2019   Impacted cerumen of both ears 07/13/2019   Sepsis (HCC)    ARDS (adult respiratory distress syndrome) (HCC)    Acute respiratory failure with hypoxemia (HCC)    Flu    Hypoxia    Encounter for intubation    Community acquired pneumonia 12/16/2016    Past Surgical History:  Procedure Laterality Date   CIRCUMCISION N/A 04/07/2013   Procedure: CIRCUMCISION ;  Surgeon: Thomasine Oiler, MD;  Location: [facility];  Service: Urology;  Laterality: N/A;       Home Medications    Prior to Admission medications  Medication Sig Start Date End Date Taking? Authorizing Provider  feeding supplement, GLUCERNA SHAKE, (GLUCERNA SHAKE) LIQD Take 237 mLs by mouth 2 (two) times daily between meals. 12/29/16   Guido Hire, MD  metFORMIN (GLUCOPHAGE) 1000 MG tablet Take 1,000 mg by mouth 2 (two) times daily. 12/09/16   [provider]  metFORMIN (GLUCOPHAGE) 1000 MG tablet Take 1,000 mg by mouth daily with breakfast.    [provider]  oseltamivir   (TAMIFLU ) 75 MG capsule Take 1 capsule (75 mg total) by mouth every 12 (twelve) hours. 12/20/23   Raspet, Erin K, PA-C  promethazine -dextromethorphan (PROMETHAZINE -DM) 6.25-15 MG/5ML syrup Take 5 mLs by mouth 3 (three) times daily as needed for cough. 12/20/23   Raspet, Rocky POUR, PA-C    Family History No family history on file.  Social History Social History[1]   Allergies   Patient has no known allergies.   Review of Systems Review of Systems   Physical Exam Triage Vital Signs ED Triage Vitals  Encounter Vitals Group     BP 01/02/25 1056 128/78     Girls Systolic BP Percentile --      Girls Diastolic BP Percentile --      Boys Systolic BP Percentile --      Boys Diastolic BP Percentile --      Pulse Rate 01/02/25 1056 72     Resp 01/02/25 1056 18     Temp 01/02/25 1056 98.2 F (36.8 C)     Temp Source 01/02/25 1056 Oral     SpO2 01/02/25 1056 99 %     Weight 01/02/25 1055 180 lb (81.6 kg)     Height 01/02/25 1055 5' 8.5 (1.74 m)     Head Circumference --      Peak Flow --      Pain  Score 01/02/25 1054 0     Pain Loc --      Pain Education --      Exclude from Growth Chart --    No data found.  Updated Vital Signs BP 128/78 (BP Location: Left Arm)   Pulse 72   Temp 98.2 F (36.8 C) (Oral)   Resp 18   Ht 5' 8.5 (1.74 m)   Wt 180 lb (81.6 kg)   SpO2 99%   BMI 26.97 kg/m   Visual Acuity Right Eye Distance:   Left Eye Distance:   Bilateral Distance:    Right Eye Near:   Left Eye Near:    Bilateral Near:     Physical Exam Vitals and nursing note reviewed.  Constitutional:      General: He is not in acute distress.    Appearance: Normal appearance. He is not ill-appearing, toxic-appearing or diaphoretic.  HENT:     Nose: Congestion (moderately enlarged turbinates) present. No rhinorrhea.     Mouth/Throat:     Mouth: Mucous membranes are moist.     Pharynx: Oropharynx is clear. No oropharyngeal exudate or posterior oropharyngeal erythema.  Eyes:      General: No scleral icterus. Cardiovascular:     Rate and Rhythm: Normal rate and regular rhythm.     Heart sounds: Normal heart sounds.  Pulmonary:     Effort: Pulmonary effort is normal. No respiratory distress.     Breath sounds: Normal breath sounds. No wheezing or rhonchi.  Skin:    General: Skin is warm.  Neurological:     Mental Status: He is alert and oriented to person, place, and time.  Psychiatric:        Mood and Affect: Mood normal.        Behavior: Behavior normal.      UC Treatments / Results  Labs (all labs ordered are listed, but only abnormal results are displayed) Labs Reviewed - No data to display  EKG   Radiology No results found.  Procedures Procedures (including critical care time)  Medications Ordered in UC Medications - No data to display  Initial Impression / Assessment and Plan / UC Course  I have reviewed the triage vital signs and the nursing notes.  Pertinent labs & imaging results that were available during my care of the patient were reviewed by me and considered in my medical decision making (see chart for details).      Final Clinical Impressions(s) / UC Diagnoses   Final diagnoses:  Viral URI     Discharge Instructions      You been diagnosed with a viral illness today. -Viruses have to run their course and medicines that are prescribed are meant to help with symptoms. - With viruses usually feel poorly from 3 to 7 days with cough being the last symptoms to resolve.  -Cough can linger from days to weeks.  Antibiotics are not effective for viruses. -If your cough lasts more than 2 weeks and you are coughing so hard that you are vomiting or feel like you could pass out we need to follow-up with PCP for further testing and evaluation. -Rest, increase water  intake, may use pseudoephedrine for nasal congestion, Delsym (dextromethorphan) or honey as needed for cough, and ibuprofen  and/or Tylenol  as directed on packaging for pain  and fever. -If you have hypertension you should take Coricidin or other OTC meds approved for people with high blood pressure. -You may use a spoonful of honey every 4-6 hours as  needed for throat pain and cough. -Warm tea with honey and lemon are helpful for soothe throat as well.  Chloraseptic and Cepacol make a throat lozenge with numbing medication, can be purchased over-the-counter. -May also use Flonase or sinus rinse for sinus pressure or nasal congestion.  Be sure to use distilled bottled water  for sinus rinses. -May use coolmist humidifier to open up nasal passages -May elevate head to assist with postnasal drainage. -If you feel poorly (fever, fatigue, shortness of breath, nausea, etc.) for more than 10 days to be sure to follow-up with PCP or in clinic for further evaluation and additional treatments. If you experience chest pain with shortness of breath or pulse oxygen  less than 95% you should report to the ER.    ED Prescriptions   None    PDMP not reviewed this encounter.    [1]  Social History Tobacco Use   Smoking status: Never   Smokeless tobacco: Never  Vaping Use   Vaping status: Never Used  Substance Use Topics   Alcohol use: No   Drug use: No     Andra Corean BROCKS, PA-C 01/02/25 1140  "
